# Patient Record
Sex: Female | Born: 1980 | Race: White | Hispanic: No | Marital: Single | Smoking: Never smoker
Health system: Southern US, Community
[De-identification: ages and names within clinical notes are randomized; demographics above are authoritative.]

## PROBLEM LIST (undated history)

## (undated) DIAGNOSIS — T7840XA Allergy, unspecified, initial encounter: Secondary | ICD-10-CM

## (undated) DIAGNOSIS — F909 Attention-deficit hyperactivity disorder, unspecified type: Secondary | ICD-10-CM

## (undated) HISTORY — PX: TONSILLECTOMY: SUR1361

## (undated) HISTORY — DX: Allergy, unspecified, initial encounter: T78.40XA

---
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
## 2001-10-31 ENCOUNTER — Other Ambulatory Visit: Admission: RE | Admit: 2001-10-31 | Discharge: 2001-10-31 | Payer: Self-pay | Admitting: *Deleted

## 2002-10-29 ENCOUNTER — Other Ambulatory Visit: Admission: RE | Admit: 2002-10-29 | Discharge: 2002-10-29 | Payer: Self-pay | Admitting: Obstetrics and Gynecology

## 2003-11-03 ENCOUNTER — Other Ambulatory Visit: Admission: RE | Admit: 2003-11-03 | Discharge: 2003-11-03 | Payer: Self-pay | Admitting: Obstetrics and Gynecology

## 2004-11-05 ENCOUNTER — Other Ambulatory Visit: Admission: RE | Admit: 2004-11-05 | Discharge: 2004-11-05 | Payer: Self-pay | Admitting: Obstetrics and Gynecology

## 2005-11-29 ENCOUNTER — Other Ambulatory Visit: Admission: RE | Admit: 2005-11-29 | Discharge: 2005-11-29 | Payer: Self-pay | Admitting: Obstetrics and Gynecology

## 2007-01-23 ENCOUNTER — Other Ambulatory Visit: Admission: RE | Admit: 2007-01-23 | Discharge: 2007-01-23 | Payer: Self-pay | Admitting: Obstetrics and Gynecology

## 2008-01-28 ENCOUNTER — Other Ambulatory Visit: Admission: RE | Admit: 2008-01-28 | Discharge: 2008-01-28 | Payer: Self-pay | Admitting: Obstetrics and Gynecology

## 2009-01-28 ENCOUNTER — Other Ambulatory Visit: Admission: RE | Admit: 2009-01-28 | Discharge: 2009-01-28 | Payer: Self-pay | Admitting: Obstetrics & Gynecology

## 2012-03-12 ENCOUNTER — Other Ambulatory Visit: Payer: Self-pay | Admitting: Family Medicine

## 2012-03-13 NOTE — Telephone Encounter (Signed)
NEEDS OV 

## 2012-09-25 ENCOUNTER — Other Ambulatory Visit: Payer: Self-pay | Admitting: Family Medicine

## 2012-10-03 ENCOUNTER — Other Ambulatory Visit: Payer: Self-pay | Admitting: Family Medicine

## 2012-10-03 DIAGNOSIS — R52 Pain, unspecified: Secondary | ICD-10-CM

## 2012-10-03 DIAGNOSIS — N390 Urinary tract infection, site not specified: Secondary | ICD-10-CM

## 2012-10-04 ENCOUNTER — Ambulatory Visit
Admission: RE | Admit: 2012-10-04 | Discharge: 2012-10-04 | Disposition: A | Discharge status: Home / Self Care | Payer: 59 | Source: Ambulatory Visit | Attending: Family Medicine | Admitting: Family Medicine

## 2012-10-04 DIAGNOSIS — N390 Urinary tract infection, site not specified: Secondary | ICD-10-CM

## 2012-10-04 DIAGNOSIS — R52 Pain, unspecified: Secondary | ICD-10-CM

## 2012-10-04 MED ORDER — IOHEXOL 300 MG/ML  SOLN
100.0000 mL | Freq: Once | INTRAMUSCULAR | Status: AC | PRN
  Administered 2012-10-04: 100 mL via INTRAVENOUS

## 2013-07-16 ENCOUNTER — Ambulatory Visit (INDEPENDENT_AMBULATORY_CARE_PROVIDER_SITE_OTHER): Payer: 59 | Admitting: Family Medicine

## 2013-07-16 VITALS — BP 116/60 | HR 91 | Temp 98.0°F | Resp 18 | Ht 64.0 in | Wt 163.0 lb

## 2013-07-16 DIAGNOSIS — R300 Dysuria: Secondary | ICD-10-CM

## 2013-07-16 DIAGNOSIS — M549 Dorsalgia, unspecified: Secondary | ICD-10-CM

## 2013-07-16 DIAGNOSIS — N390 Urinary tract infection, site not specified: Secondary | ICD-10-CM

## 2013-07-16 LAB — POCT UA - MICROSCOPIC ONLY
Bacteria, U Microscopic: NEGATIVE
Casts, Ur, LPF, POC: NEGATIVE
Crystals, Ur, HPF, POC: NEGATIVE
Mucus, UA: NEGATIVE
Yeast, UA: NEGATIVE

## 2013-07-16 LAB — POCT URINALYSIS DIPSTICK
Bilirubin, UA: NEGATIVE
Glucose, UA: NEGATIVE
Leukocytes, UA: NEGATIVE
Nitrite, UA: NEGATIVE
Protein, UA: NEGATIVE
Spec Grav, UA: 1.025
Urobilinogen, UA: 0.2
pH, UA: 5.5

## 2013-07-16 LAB — POCT URINE PREGNANCY: Preg Test, Ur: NEGATIVE

## 2013-07-16 MED ORDER — NITROFURANTOIN MONOHYD MACRO 100 MG PO CAPS: 100.0000 mg | ORAL_CAPSULE | Freq: Two times a day (BID) | ORAL | Status: DC

## 2013-07-16 NOTE — Progress Notes (Signed)
  Subjective:    Patient ID: Elaine Atkins, female    DOB: Sep 20, 1981, 32 y.o.   MRN: [id number]  HPI 32 yo female with complaint of lower abdominal and low back pain for the past 3-4 days.  No fever or chills.  Positive burning with urination.  No vomiting or diarrhea.  No vaginal discharge or bleeding.  LMP 07/04/13.  Patient has history of pyelonephritis in past and states it started similar to this.  She was recently treated with Z-pack for recent URI.  Currently asymptomatic from that.  Past Medical History  Diagnosis Date  . Allergy    History reviewed. No pertinent past surgical history. Allergies  Allergen Reactions  . Nexium [Esomeprazole Magnesium]   . Septra [Sulfamethoxazole W-Trimethoprim]     Review of Systems  As per HPI otherwise negative.    Objective:   Physical Exam Blood pressure 116/60, pulse 91, temperature 98 F (36.7 C), temperature source Oral, resp. rate 18, height 5\' 4"  (1.626 m), weight 163 lb (73.936 kg), last menstrual period 07/04/2013, SpO2 100.00%. Body mass index is 27.97 kg/(m^2). Well-developed, well nourished female who is awake, alert and oriented, in NAD. HEENT: [place]/AT, PERRL, EOMI.  Sclera and conjunctiva are clear.   OP is clear. Neck: supple, non-tender, no lymphadenopathy, thyromegaly. Heart: RRR, no murmur Lungs: normal effort, CTA Abdomen: normo-active bowel sounds, supple, mild suprapubic tenderness, no mass or organomegaly. Back:  CVA tenderness. Extremities: no cyanosis, clubbing or edema. Skin: warm and Atkins without rash. Psychologic: good mood and appropriate affect, normal speech and behavior.  Results for orders placed in visit on 07/16/13  POCT URINALYSIS DIPSTICK      Result Value Range   Color, UA YELLOW     Clarity, UA SLIGHTLY CLOUDY     Glucose, UA NEG     Bilirubin, UA NEG     Ketones, UA TRACE     Spec Grav, UA 1.025     Blood, UA TRACE     pH, UA 5.5     Protein, UA NEG     Urobilinogen, UA 0.2     Nitrite, UA  NEG     Leukocytes, UA Negative    POCT UA - MICROSCOPIC ONLY      Result Value Range   WBC, Ur, HPF, POC 0-1     RBC, urine, microscopic 0-1     Bacteria, U Microscopic NEG     Mucus, UA NEG     Epithelial cells, urine per micros 1-4     Crystals, Ur, HPF, POC NEG     Casts, Ur, LPF, POC NEG     Yeast, UA NEG    POCT URINE PREGNANCY      Result Value Range   Preg Test, Ur Negative            Assessment & Plan:  Symptoms consistent with urinary tract infection (dysuria, suprapubic pain and mild cva tenderness).  Will cover with antibiotics  Follow up in 2 weeks.

## 2013-07-16 NOTE — Patient Instructions (Signed)
Urinary Tract Infection Urinary tract infections (UTIs) can develop anywhere along your urinary tract. Your urinary tract is your body's drainage system for removing wastes and extra water. Your urinary tract includes two kidneys, two ureters, a bladder, and a urethra. Your kidneys are a pair of bean-shaped organs. Each kidney is about the size of your fist. They are located below your ribs, one on each side of your spine. CAUSES Infections are caused by microbes, which are microscopic organisms, including fungi, viruses, and bacteria. These organisms are so small that they can only be seen through a microscope. Bacteria are the microbes that most commonly cause UTIs. SYMPTOMS  Symptoms of UTIs may vary by age and gender of the patient and by the location of the infection. Symptoms in young women typically include a frequent and intense urge to urinate and a painful, burning feeling in the bladder or urethra during urination. Older women and men are more likely to be tired, shaky, and weak and have muscle aches and abdominal pain. A fever may mean the infection is in your kidneys. Other symptoms of a kidney infection include pain in your back or sides below the ribs, nausea, and vomiting. DIAGNOSIS To diagnose a UTI, your caregiver will ask you about your symptoms. Your caregiver also will ask to provide a urine sample. The urine sample will be tested for bacteria and white blood cells. White blood cells are made by your body to help fight infection. TREATMENT  Typically, UTIs can be treated with medication. Because most UTIs are caused by a bacterial infection, they usually can be treated with the use of antibiotics. The choice of antibiotic and length of treatment depend on your symptoms and the type of bacteria causing your infection. HOME CARE INSTRUCTIONS  If you were prescribed antibiotics, take them exactly as your caregiver instructs you. Finish the medication even if you feel better after you  have only taken some of the medication.  Drink enough water and fluids to keep your urine clear or pale yellow.  Avoid caffeine, tea, and carbonated beverages. They tend to irritate your bladder.  Empty your bladder often. Avoid holding urine for long periods of time.  Empty your bladder before and after sexual intercourse.  After a bowel movement, women should cleanse from front to back. Use each tissue only once. SEEK MEDICAL CARE IF:   You have back pain.  You develop a fever.  Your symptoms do not begin to resolve within 3 days. SEEK IMMEDIATE MEDICAL CARE IF:   You have severe back pain or lower abdominal pain.  You develop chills.  You have nausea or vomiting.  You have continued burning or discomfort with urination. MAKE SURE YOU:   Understand these instructions.  Will watch your condition.  Will get help right away if you are not doing well or get worse. Document Released: 08/17/2005 Document Revised: 05/08/2012 Document Reviewed: 12/16/2011 [facility] Patient Information 2014 [place], [place].  Your symptoms are consistent with a UTI despite the negative urine test.  A culture is being sent today and you are being started on antibiotics.  If you have any problems return immediately, otherwise take the antibiotics as directed.

## 2013-07-18 LAB — URINE CULTURE: Colony Count: NO GROWTH

## 2013-07-18 NOTE — Progress Notes (Signed)
History and physical exam reviewed in detail; labs reviewed; agree with A/P.

## 2013-09-26 ENCOUNTER — Other Ambulatory Visit: Payer: Self-pay

## 2014-07-25 ENCOUNTER — Ambulatory Visit (INDEPENDENT_AMBULATORY_CARE_PROVIDER_SITE_OTHER): Payer: 59 | Admitting: Physician Assistant

## 2014-07-25 VITALS — BP 128/74 | HR 99 | Temp 98.1°F | Resp 16 | Ht 64.0 in | Wt 165.0 lb

## 2014-07-25 DIAGNOSIS — L02419 Cutaneous abscess of limb, unspecified: Secondary | ICD-10-CM

## 2014-07-25 DIAGNOSIS — L03119 Cellulitis of unspecified part of limb: Principal | ICD-10-CM

## 2014-07-25 MED ORDER — DOXYCYCLINE HYCLATE 100 MG PO CAPS: 100.0000 mg | ORAL_CAPSULE | Freq: Two times a day (BID) | ORAL | Status: AC

## 2014-07-25 NOTE — Patient Instructions (Signed)
Take the antibiotic as prescribed. Apply a warm compress to the area for 15-20 minutes 2-4 times each day.

## 2014-07-25 NOTE — Progress Notes (Signed)
   Subjective:    Patient ID: Elaine Atkins, female    DOB: 03-Oct-1981, 33 y.o.   MRN: [id number]   PCP: No primary provider on file.  Chief Complaint  Patient presents with  . Insect Bite    fire ants   Medications, allergies, past medical history, surgical history, family history, social history and problem list reviewed and updated.  HPI  This 33 y.o. female presents for evaluation of lesions on her LEFT leg. She stepped in a fire ant hill on Sunday (07/20/2014) afternoon, and got bites on the foot and up her leg. The next day, two lesions became painful, large and red.  Popped them, with some improvement, but still very painful. No fever, chills, nausea/vomiting.  Review of Systems     Objective:   Physical Exam  Constitutional: She is oriented to person, place, and time. She appears well-developed and well-nourished. No distress.  BP 128/74  Pulse 99  Temp(Src) 98.1 F (36.7 C) (Oral)  Resp 16  Ht  (1.626 m)  Wt 165 lb (74.844 kg)  BMI 28.31 kg/m2  SpO2 98%  LMP 07/11/2014   Pulmonary/Chest: Effort normal.  Neurological: She is alert and oriented to person, place, and time.  Skin: Skin is warm and Atkins.     The forefoot with multiple small lesions, consistent with resolving bites.  The two lesions higher on the leg each measure approximately 1 cm and are excoriated with surrounding erythema and induration. Tender.  Non-fluctuant. No drainage.  Psychiatric: She has a normal mood and affect. Her speech is normal and behavior is normal.          Assessment & Plan:  1. Cellulitis and abscess of leg Secondary to fire ant bites.  - doxycycline (VIBRAMYCIN) 100 MG capsule; Take 1 capsule (100 mg total) by mouth 2 (two) times daily.  Dispense: 20 capsule; Refill: 0  Return if symptoms worsen or fail to improve.  Fernande Bras, PA-C Physician Assistant-Certified [facility]

## 2014-10-07 ENCOUNTER — Ambulatory Visit (INDEPENDENT_AMBULATORY_CARE_PROVIDER_SITE_OTHER): Payer: 59 | Admitting: Family Medicine

## 2014-10-07 VITALS — BP 110/68 | HR 99 | Temp 97.5°F | Resp 18 | Ht 64.0 in | Wt 163.0 lb

## 2014-10-07 DIAGNOSIS — M545 Low back pain: Secondary | ICD-10-CM

## 2014-10-07 DIAGNOSIS — R102 Pelvic and perineal pain: Secondary | ICD-10-CM

## 2014-10-07 DIAGNOSIS — R5383 Other fatigue: Secondary | ICD-10-CM

## 2014-10-07 DIAGNOSIS — R109 Unspecified abdominal pain: Secondary | ICD-10-CM

## 2014-10-07 LAB — POCT WET PREP WITH KOH
BACTERIA WET PREP HPF POC: NEGATIVE
Clue Cells Wet Prep HPF POC: NEGATIVE
KOH Prep POC: NEGATIVE
RBC Wet Prep HPF POC: NEGATIVE
Trichomonas, UA: NEGATIVE
WBC Wet Prep HPF POC: NEGATIVE
Yeast Wet Prep HPF POC: NEGATIVE

## 2014-10-07 LAB — POCT UA - MICROSCOPIC ONLY
BACTERIA, U MICROSCOPIC: NEGATIVE
CASTS, UR, LPF, POC: NEGATIVE
Crystals, Ur, HPF, POC: NEGATIVE
MUCUS UA: NEGATIVE
RBC, urine, microscopic: NEGATIVE
WBC, UR, HPF, POC: NEGATIVE
YEAST UA: NEGATIVE

## 2014-10-07 LAB — POCT CBC
Granulocyte percent: 52 %G (ref 37–80)
HEMATOCRIT: 41.4 % (ref 37.7–47.9)
HEMOGLOBIN: 13.6 g/dL (ref 12.2–16.2)
LYMPH, POC: 2.6 (ref 0.6–3.4)
MCH: 29 pg (ref 27–31.2)
MCHC: 32.8 g/dL (ref 31.8–35.4)
MCV: 88.6 fL (ref 80–97)
MID (cbc): 0.2 (ref 0–0.9)
MPV: 7.2 fL (ref 0–99.8)
POC GRANULOCYTE: 3.1 (ref 2–6.9)
POC LYMPH %: 43.8 % (ref 10–50)
POC MID %: 4.2 %M (ref 0–12)
Platelet Count, POC: 257 10*3/uL (ref 142–424)
RBC: 4.68 M/uL (ref 4.04–5.48)
RDW, POC: 13.4 %
WBC: 5.9 10*3/uL (ref 4.6–10.2)

## 2014-10-07 LAB — POCT URINALYSIS DIPSTICK
BILIRUBIN UA: NEGATIVE
Glucose, UA: NEGATIVE
KETONES UA: NEGATIVE
LEUKOCYTES UA: NEGATIVE
NITRITE UA: NEGATIVE
PH UA: 5.5
PROTEIN UA: NEGATIVE
Spec Grav, UA: <=1.005
Urobilinogen, UA: 0.2

## 2014-10-07 LAB — POCT URINE PREGNANCY: Preg Test, Ur: NEGATIVE

## 2014-10-07 NOTE — Patient Instructions (Signed)
Ovarian Cyst An ovarian cyst is a fluid-filled sac that forms on an ovary. The ovaries are small organs that produce eggs in women. Various types of cysts can form on the ovaries. Most are not cancerous. Many do not cause problems, and they often go away on their own. Some may cause symptoms and require treatment. Common types of ovarian cysts include:  Functional cysts--These cysts may occur every month during the menstrual cycle. This is normal. The cysts usually go away with the next menstrual cycle if the woman does not get pregnant. Usually, there are no symptoms with a functional cyst.  Endometrioma cysts--These cysts form from the tissue that lines the uterus. They are also called "chocolate cysts" because they become filled with blood that turns brown. This type of cyst can cause pain in the lower abdomen during intercourse and with your menstrual period.  Cystadenoma cysts--This type develops from the cells on the outside of the ovary. These cysts can get very big and cause lower abdomen pain and pain with intercourse. This type of cyst can twist on itself, cut off its blood supply, and cause severe pain. It can also easily rupture and cause a lot of pain.  Dermoid cysts--This type of cyst is sometimes found in both ovaries. These cysts may contain different kinds of body tissue, such as skin, teeth, hair, or cartilage. They usually do not cause symptoms unless they get very big.  Theca lutein cysts--These cysts occur when too much of a certain hormone (human chorionic gonadotropin) is produced and overstimulates the ovaries to produce an egg. This is most common after procedures used to assist with the conception of a baby (in vitro fertilization). CAUSES   Fertility drugs can cause a condition in which multiple large cysts are formed on the ovaries. This is called ovarian hyperstimulation syndrome.  A condition called polycystic ovary syndrome can cause hormonal imbalances that can lead to  nonfunctional ovarian cysts. SIGNS AND SYMPTOMS  Many ovarian cysts do not cause symptoms. If symptoms are present, they may include:  Pelvic pain or pressure.  Pain in the lower abdomen.  Pain during sexual intercourse.  Increasing girth (swelling) of the abdomen.  Abnormal menstrual periods.  Increasing pain with menstrual periods.  Stopping having menstrual periods without being pregnant. DIAGNOSIS  These cysts are commonly found during a routine or annual pelvic exam. Tests may be ordered to find out more about the cyst. These tests may include:  Ultrasound.  X-ray of the pelvis.  CT scan.  MRI.  Blood tests. TREATMENT  Many ovarian cysts go away on their own without treatment. Your health care provider may want to check your cyst regularly for 2-3 months to see if it changes. For women in menopause, it is particularly important to monitor a cyst closely because of the higher rate of ovarian cancer in menopausal women. When treatment is needed, it may include any of the following:  A procedure to drain the cyst (aspiration). This may be done using a long needle and ultrasound. It can also be done through a laparoscopic procedure. This involves using a thin, lighted tube with a tiny camera on the end (laparoscope) inserted through a small incision.  Surgery to remove the whole cyst. This may be done using laparoscopic surgery or an open surgery involving a larger incision in the lower abdomen.  Hormone treatment or birth control pills. These methods are sometimes used to help dissolve a cyst. HOME CARE INSTRUCTIONS   Only take over-the-counter   or prescription medicines as directed by your health care provider.  Follow up with your health care provider as directed.  Get regular pelvic exams and Pap tests. SEEK MEDICAL CARE IF:   Your periods are late, irregular, or painful, or they stop.  Your pelvic pain or abdominal pain does not go away.  Your abdomen becomes  larger or swollen.  You have pressure on your bladder or trouble emptying your bladder completely.  You have pain during sexual intercourse.  You have feelings of fullness, pressure, or discomfort in your stomach.  You lose weight for no apparent reason.  You feel generally ill.  You become constipated.  You lose your appetite.  You develop acne.  You have an increase in body and facial hair.  You are gaining weight, without changing your exercise and eating habits.  You think you are pregnant. SEEK IMMEDIATE MEDICAL CARE IF:   You have increasing abdominal pain.  You feel sick to your stomach (nauseous), and you throw up (vomit).  You develop a fever that comes on suddenly.  You have abdominal pain during a bowel movement.  Your menstrual periods become heavier than usual. MAKE SURE YOU:  Understand these instructions.  Will watch your condition.  Will get help right away if you are not doing well or get worse. Document Released: 11/07/2005 Document Revised: 11/12/2013 Document Reviewed: 07/15/2013 [facility] Patient Information 2015 [place], [place]. This information is not intended to replace advice given to you by your health care provider. Make sure you discuss any questions you have with your health care provider.  Your pregnancy test is negative. Your urinalysis is negative. You do not have an elevated white count, therefore infection is not as likely.  - we will call you with the results of your other labs once they are available.  - we have have ordered abdominal US, they will callyou to schedule within a few days. - take naproxen for pain -if your pain becomes worse then please call back in to discuss.

## 2014-10-07 NOTE — Progress Notes (Signed)
Subjective:    Patient ID: Elaine Atkins, female    DOB: Jul 24, 1981, 33 y.o.   MRN: [id number]  HPI Elaine DryCarrie A Rotenberg is a 33 y.o. female   Lower Backpain: Pt states she has a kidney infection a few years ago and feels it is consistent with the pain she had when she had a kidney infection. Patient denies trauma or strain in her back. She denies dysuria, fever, chills, diarrhea, urine odor or discoloration. Endorses fatigue, lower abdominal pain for three days. Back pain started yesterday. Patient is tolerating PO. She does not have a history of kidney stones, there is a family history. She is allergic to Suprax.   Patient's last menstrual period was 09/29/2014. She uses the nuvoring for Birth control, she does admit to missing 2 days because of pharmacy issue. She has a history of cyst on her ovaries, but nothing current. Has had unprotected sex, with partner, they have been monogamous for years. No vaginal irritation or discharge.   There are no active problems to display for this patient.  Past Medical History  Diagnosis Date  . Allergy    Past Surgical History  Procedure Laterality Date  . Tonsillectomy     Allergies  Allergen Reactions  . Nexium [Esomeprazole Magnesium] Anaphylaxis  . Suprax [Cefixime]     Childhood; unknown reaction   Prior to Admission medications   Medication Sig Start Date End Date Taking? Authorizing Provider  etonogestrel-ethinyl estradiol (NUVARING) 0.12-0.015 MG/24HR vaginal ring Place 1 each vaginally every 28 (twenty-eight) days. Insert vaginally and leave in place for 3 consecutive weeks, then remove for 1 week.   Yes Historical Provider, MD  fluticasone (FLONASE) 50 MCG/ACT nasal spray instill 1 to 2 sprays into each nostril once daily as directed 03/12/12  Yes Sondra Bargesyan M Dunn, PA-C   History   Social History  . Marital Status: Single    Spouse Name: n/a    Number of Children: 0  . Years of Education: N/A   Occupational History  . Print production plannerffice Manager      Social History Main Topics  . Smoking status: Never Smoker   . Smokeless tobacco: Never Used  . Alcohol Use: No  . Drug Use: Not on file  . Sexual Activity: Not on file   Other Topics Concern  . Not on file   Social History Narrative    Review of Systems Per hpi    Objective:   Physical Exam Filed Vitals:   10/07/14 0910  BP: 110/68  Pulse: 99  Temp: 97.5 F (36.4 C)  TempSrc: Oral  Resp: 18  Height: 5\' 4"  (1.626 m)  Weight: 163 lb (73.936 kg)  SpO2: 97%   Gen: [place], caucasian female, NAD, Non-toxic in appearance. Appears fatigued.  HEENT: AT. [place].  Bilateral eyes without injections or icterus. MMM.  CV: RRR  Chest: CTAB, no wheeze or crackles Abd: Soft. Flat. ND.  Suprapubic tenderness BS present. No Masses palpated.  MSK: CVA tenderness left.  Skin: No rashes, purpura or petechiae.   GYN:  External genitalia within normal limits.  Vaginal mucosa pink, moist, normal rugae.  Mildly friable cervix without nabothian cyst, no discharge or bleeding noted on speculum exam. No cervical motion tenderness. No adnexal masses bilaterally. TTP left>> right adnexal.        Assessment & Plan:  Elaine DryCarrie A Robak is a 33 y.o. female presents with ABD PAIN and backpain left>right.  - UA and micro normal - Upreg: Negative - CBC: WNL,  No elevated WBC - STD wet prep and cultures collected - pelvic US ordered.  - naproxen for pain, PRN  Felix PaciniKuneff, Renee DO PGY-3 [facility]  Patient discussed with Dr. Claiborne BillingsKuneff. Agree with assessment and plan of care per her note. DDX of ovarian cyst, or possible early gastroenteritis. Reassuring CBC, U/a and et prep in office. Agree with pelvic ultrasound and STI testing. Sx care and rtc precautions.

## 2014-10-08 ENCOUNTER — Inpatient Hospital Stay: Admission: RE | Admit: 2014-10-08 | Payer: 59 | Source: Ambulatory Visit

## 2014-10-08 ENCOUNTER — Other Ambulatory Visit: Payer: Self-pay | Admitting: Family Medicine

## 2014-10-08 DIAGNOSIS — R102 Pelvic and perineal pain: Secondary | ICD-10-CM

## 2014-10-08 LAB — GC/CHLAMYDIA PROBE AMP, URINE
Chlamydia, Swab/Urine, PCR: NEGATIVE
GC Probe Amp, Urine: NEGATIVE

## 2014-10-09 ENCOUNTER — Ambulatory Visit
Admission: RE | Admit: 2014-10-09 | Discharge: 2014-10-09 | Disposition: A | Discharge status: Home / Self Care | Payer: 59 | Source: Ambulatory Visit | Attending: Family Medicine | Admitting: Family Medicine

## 2014-10-09 DIAGNOSIS — R102 Pelvic and perineal pain: Secondary | ICD-10-CM

## 2014-10-17 ENCOUNTER — Encounter: Payer: Self-pay | Admitting: Radiology

## 2014-10-26 ENCOUNTER — Ambulatory Visit (INDEPENDENT_AMBULATORY_CARE_PROVIDER_SITE_OTHER): Payer: 59

## 2014-10-26 ENCOUNTER — Ambulatory Visit (INDEPENDENT_AMBULATORY_CARE_PROVIDER_SITE_OTHER): Payer: 59 | Admitting: Family Medicine

## 2014-10-26 VITALS — BP 140/92 | HR 117 | Temp 98.8°F | Resp 16 | Ht 64.0 in | Wt 165.6 lb

## 2014-10-26 DIAGNOSIS — M545 Low back pain: Secondary | ICD-10-CM

## 2014-10-26 DIAGNOSIS — R1032 Left lower quadrant pain: Secondary | ICD-10-CM

## 2014-10-26 DIAGNOSIS — K5909 Other constipation: Secondary | ICD-10-CM

## 2014-10-26 LAB — POCT URINALYSIS DIPSTICK
Bilirubin, UA: NEGATIVE
Blood, UA: NEGATIVE
Glucose, UA: NEGATIVE
KETONES UA: NEGATIVE
Leukocytes, UA: NEGATIVE
Nitrite, UA: NEGATIVE
PROTEIN UA: NEGATIVE
Spec Grav, UA: <=1.005
Urobilinogen, UA: 0.2
pH, UA: 5

## 2014-10-26 LAB — POCT UA - MICROSCOPIC ONLY
BACTERIA, U MICROSCOPIC: NEGATIVE
CRYSTALS, UR, HPF, POC: NEGATIVE
Casts, Ur, LPF, POC: NEGATIVE
MUCUS UA: NEGATIVE
RBC, urine, microscopic: NEGATIVE
WBC, Ur, HPF, POC: NEGATIVE
YEAST UA: NEGATIVE

## 2014-10-26 MED ORDER — METHOCARBAMOL 500 MG PO TABS: 500.0000 mg | ORAL_TABLET | Freq: Every evening | ORAL | Status: DC | PRN

## 2014-10-26 MED ORDER — MELOXICAM 15 MG PO TABS: 15.0000 mg | ORAL_TABLET | Freq: Every day | ORAL | Status: DC

## 2014-10-26 NOTE — Progress Notes (Signed)
Subjective:    Patient ID: Elaine Atkins, female    DOB: [birth year], 33 y.o.   MRN: [id number]  10/26/2014  Abdominal Pain; Fatigue; and Back Pain   HPI This 33 y.o. female presents for evaluation of persistent lower back pain.  Onset three weeks ago.  Developed L lower back side.  History of UTI a few years ago; had lower back pain and lower pelvic pian.  Presented last month for symptoms; performed urine test and negative. S/p pelvic exam.  Lower pelvic pain; s/p pelvic ultrasound that was normal.  S/p Wet Prep negative; GC/Chlam negative; urine pregnancy negative; CBC normal; u/a negative.  Went to GYN; normal exam; felt due to muscular process, urological process or GI process.  No improvement.  Has seen changes while in bathroom; has seen small brown flecks and red flecks in urine.    Mild dysuria, +urgency, +frequency mild; nocturia at baseline.  No fever.  +chills.    Some issues with loose stools; two days with constipation.  Unable to completely empty.  Baseline is dail bowel movement.  No stool softener; has been increasing fiber.  Improvement in bowel movements; does not feel constipation.   Sharp pain on L side. No heartburn, n/v.  Appetite normal.  Eating can make pain worse.    Lower back pain is transient.  Worse with sitting; less severe with standing. No raidation into legs; does have achiness in B legs.  B hips are tender.     PCP: [facility]  Regular menses; + Nuvaring.  LMP 10-04-14.  Really concerned about a recurrent UTI or kidney stone.   Review of Systems  Constitutional: Negative for fever, chills, diaphoresis and fatigue.  Gastrointestinal: Positive for abdominal pain and constipation. Negative for nausea, vomiting, diarrhea, blood in stool, abdominal distention and anal bleeding.  Genitourinary: Positive for urgency, frequency and pelvic pain. Negative for dysuria, hematuria, flank pain, decreased urine volume, vaginal bleeding, vaginal discharge,  genital sores, vaginal pain and menstrual problem.  Musculoskeletal: Positive for myalgias and back pain.  Skin: Negative for rash.    Past Medical History  Diagnosis Date  . Allergy    Past Surgical History  Procedure Laterality Date  . Tonsillectomy     Allergies  Allergen Reactions  . Nexium [Esomeprazole Magnesium] Anaphylaxis  . Suprax [Cefixime]     Childhood; unknown reaction   Current Outpatient Prescriptions  Medication Sig Dispense Refill  . etonogestrel-ethinyl estradiol (NUVARING) 0.12-0.015 MG/24HR vaginal ring Place 1 each vaginally every 28 (twenty-eight) days. Insert vaginally and leave in place for 3 consecutive weeks, then remove for 1 week.    . fluticasone (FLONASE) 50 MCG/ACT nasal spray instill 1 to 2 sprays into each nostril once daily as directed 16 g 0   No current facility-administered medications for this visit.       Objective:    BP 140/92 mmHg  Pulse 117  Temp(Src) 98.8 F (37.1 C) (Oral)  Resp 16  Ht 5\' 4"  (1.626 m)  Wt 165 lb 9.6 oz (75.116 kg)  BMI 28.41 kg/m2  SpO2 98%  LMP 09/29/2014 Physical Exam  Constitutional: She is oriented to person, place, and time. She appears well-developed and well-nourished. No distress.  HENT:  Head: Normocephalic and atraumatic.  Eyes: Conjunctivae are normal. Pupils are equal, round, and reactive to light.  Neck: Normal range of motion. Neck supple.  Cardiovascular: Normal rate, regular rhythm and normal heart sounds.  Exam reveals no gallop and no friction rub.  No murmur heard. Pulmonary/Chest: Effort normal and breath sounds normal. She has no wheezes. She has no rales.  Abdominal: Soft. Bowel sounds are normal. She exhibits no distension and no mass. There is no hepatosplenomegaly. There is tenderness in the left lower quadrant. There is CVA tenderness. There is no rebound and no guarding. No hernia.  Neurological: She is alert and oriented to person, place, and time.  Skin: No rash noted. She  is not diaphoretic.  Psychiatric: She has a normal mood and affect. Her behavior is normal.  Nursing note and vitals reviewed.  Results for orders placed or performed in visit on 10/26/14  POCT UA - Microscopic Only  Result Value Ref Range   WBC, Ur, HPF, POC neg    RBC, urine, microscopic neg    Bacteria, U Microscopic neg    Mucus, UA neg    Epithelial cells, urine per micros 0-2    Crystals, Ur, HPF, POC neg    Casts, Ur, LPF, POC neg    Yeast, UA neg   POCT urinalysis dipstick  Result Value Ref Range   Color, UA Yellow    Clarity, UA Clear    Glucose, UA Neg    Bilirubin, UA Neg    Ketones, UA Neg    Spec Grav, UA <=1.005    Blood, UA Neg    pH, UA 5.0    Protein, UA Neg    Urobilinogen, UA 0.2    Nitrite, UA Neg    Leukocytes, UA Negative    [facility] reading (PRIMARY) by  Dr. Katrinka BlazingSmith. KUB:  MODERATE STOOL BURDEN; LUMBAR SPINE: NAD      Assessment & Plan:   1. Low back pain, unspecified back pain laterality, with sciatica presence unspecified   2. Abdominal pain, LLQ   3. Other constipation     1.  Lower back pain:  Persistent.  Worsens with changes in position so suggestive of musculoskeletal etiology.  xrays obtained and negative; rx for Mobic 15mg  daily and Robaxin provided.  Home exercise program provided to perform daily.  Call in two weeks if no improvement. 2.  LLQ pain:  Persistent/unchanged.  Pelvic ultrasound negative.  S/p gynecology evaluation; Wet prep negative; GC/Chlam negative.  Urine negative x 2.   3. Constipation: persistent; recommend Miralax daily for two weeks. Encourage fluids and fiber.      No orders of the defined types were placed in this encounter.    No Follow-up on file.     Nilda SimmerKristi Oda Lansdowne, M.D.  [facility]  [place] [address] [place], [place]  [id number] [phone] phone [phone] fax

## 2014-10-26 NOTE — Patient Instructions (Addendum)
1.  RECOMMEND MIRALAX ONE CAPFUL DAILY FOR TWO WEEKS. 2.  TAKING ROBAXIN 1-2 AT BEDTIME FOR TWO WEEKS. 3. PERFORM LOW BACK EXERCISES DAILY.   Low Back Sprain with Rehab  A sprain is an injury in which a ligament is torn. The ligaments of the lower back are vulnerable to sprains. However, they are strong and require great force to be injured. These ligaments are important for stabilizing the spinal column. Sprains are classified into three categories. Grade 1 sprains cause pain, but the tendon is not lengthened. Grade 2 sprains include a lengthened ligament, due to the ligament being stretched or partially ruptured. With grade 2 sprains there is still function, although the function may be decreased. Grade 3 sprains involve a complete tear of the tendon or muscle, and function is usually impaired. SYMPTOMS   Severe pain in the lower back.  Sometimes, a feeling of a "pop," "snap," or tear, at the time of injury.  Tenderness and sometimes swelling at the injury site.  Uncommonly, bruising (contusion) within 48 hours of injury.  Muscle spasms in the back. CAUSES  Low back sprains occur when a force is placed on the ligaments that is greater than they can handle. Common causes of injury include:  Performing a stressful act while off-balance.  Repetitive stressful activities that involve movement of the lower back.  Direct hit (trauma) to the lower back. RISK INCREASES WITH:  Contact sports (football, wrestling).  Collisions (major skiing accidents).  Sports that require throwing or lifting (baseball, weightlifting).  Sports involving twisting of the spine (gymnastics, diving, tennis, golf).  Poor strength and flexibility.  Inadequate protection.  Previous back injury or surgery (especially fusion). PREVENTION  Wear properly fitted and padded protective equipment.  Warm up and stretch properly before activity.  Allow for adequate recovery between workouts.  Maintain  physical fitness:  Strength, flexibility, and endurance.  Cardiovascular fitness.  Maintain a healthy body weight. PROGNOSIS  If treated properly, low back sprains usually heal with non-surgical treatment. The length of time for healing depends on the severity of the injury.  RELATED COMPLICATIONS   Recurring symptoms, resulting in a chronic problem.  Chronic inflammation and pain in the low back.  Delayed healing or resolution of symptoms, especially if activity is resumed too soon.  Prolonged impairment.  Unstable or arthritic joints of the low back. TREATMENT  Treatment first involves the use of ice and medicine, to reduce pain and inflammation. The use of strengthening and stretching exercises may help reduce pain with activity. These exercises may be performed at home or with a therapist. Severe injuries may require referral to a therapist for further evaluation and treatment, such as ultrasound. Your caregiver may advise that you wear a back brace or corset, to help reduce pain and discomfort. Often, prolonged bed rest results in greater harm then benefit. Corticosteroid injections may be recommended. However, these should be reserved for the most serious cases. It is important to avoid using your back when lifting objects. At night, sleep on your back on a firm mattress, with a pillow placed under your knees. If non-surgical treatment is unsuccessful, surgery may be needed.  MEDICATION   If pain medicine is needed, nonsteroidal anti-inflammatory medicines (aspirin and ibuprofen), or other minor pain relievers (acetaminophen), are often advised.  Do not take pain medicine for 7 days before surgery.  Prescription pain relievers may be given, if your caregiver thinks they are needed. Use only as directed and only as much as you need.  Ointments applied to the skin may be helpful.  Corticosteroid injections may be given by your caregiver. These injections should be reserved for the  most serious cases, because they may only be given a certain number of times. HEAT AND COLD  Cold treatment (icing) should be applied for 10 to 15 minutes every 2 to 3 hours for inflammation and pain, and immediately after activity that aggravates your symptoms. Use ice packs or an ice massage.  Heat treatment may be used before performing stretching and strengthening activities prescribed by your caregiver, physical therapist, or athletic trainer. Use a heat pack or a warm water soak. SEEK MEDICAL CARE IF:   Symptoms get worse or do not improve in 2 to 4 weeks, despite treatment.  You develop numbness or weakness in either leg.  You lose bowel or bladder function.  Any of the following occur after surgery: fever, increased pain, swelling, redness, drainage of fluids, or bleeding in the affected area.  New, unexplained symptoms develop. (Drugs used in treatment may produce side effects.) EXERCISES  RANGE OF MOTION (ROM) AND STRETCHING EXERCISES - Low Back Sprain Most people with lower back pain will find that their symptoms get worse with excessive bending forward (flexion) or arching at the lower back (extension). The exercises that will help resolve your symptoms will focus on the opposite motion.  Your physician, physical therapist or athletic trainer will help you determine which exercises will be most helpful to resolve your lower back pain. Do not complete any exercises without first consulting with your caregiver. Discontinue any exercises which make your symptoms worse, until you speak to your caregiver. If you have pain, numbness or tingling which travels down into your buttocks, leg or foot, the goal of the therapy is for these symptoms to move closer to your back and eventually resolve. Sometimes, these leg symptoms will get better, but your lower back pain may worsen. This is often an indication of progress in your rehabilitation. Be very alert to any changes in your symptoms and the  activities in which you participated in the 24 hours prior to the change. Sharing this information with your caregiver will allow him or her to most efficiently treat your condition. These exercises may help you when beginning to rehabilitate your injury. Your symptoms may resolve with or without further involvement from your physician, physical therapist or athletic trainer. While completing these exercises, remember:   Restoring tissue flexibility helps normal motion to return to the joints. This allows healthier, less painful movement and activity.  An effective stretch should be held for at least 30 seconds.  A stretch should never be painful. You should only feel a gentle lengthening or release in the stretched tissue. FLEXION RANGE OF MOTION AND STRETCHING EXERCISES: STRETCH - Flexion, Single Knee to Chest   Lie on a firm bed or floor with both legs extended in front of you.  Keeping one leg in contact with the floor, bring your opposite knee to your chest. Hold your leg in place by either grabbing behind your thigh or at your knee.  Pull until you feel a gentle stretch in your low back. Hold __________ seconds.  Slowly release your grasp and repeat the exercise with the opposite side. Repeat __________ times. Complete this exercise __________ times per day.  STRETCH - Flexion, Double Knee to Chest  Lie on a firm bed or floor with both legs extended in front of you.  Keeping one leg in contact with the floor, bring  your opposite knee to your chest.  Tense your stomach muscles to support your back and then lift your other knee to your chest. Hold your legs in place by either grabbing behind your thighs or at your knees.  Pull both knees toward your chest until you feel a gentle stretch in your low back. Hold __________ seconds.  Tense your stomach muscles and slowly return one leg at a time to the floor. Repeat __________ times. Complete this exercise __________ times per day.    STRETCH - Low Trunk Rotation  Lie on a firm bed or floor. Keeping your legs in front of you, bend your knees so they are both pointed toward the ceiling and your feet are flat on the floor.  Extend your arms out to the side. This will stabilize your upper body by keeping your shoulders in contact with the floor.  Gently and slowly drop both knees together to one side until you feel a gentle stretch in your low back. Hold for __________ seconds.  Tense your stomach muscles to support your lower back as you bring your knees back to the starting position. Repeat the exercise to the other side. Repeat __________ times. Complete this exercise __________ times per day  EXTENSION RANGE OF MOTION AND FLEXIBILITY EXERCISES: STRETCH - Extension, Prone on Elbows   Lie on your stomach on the floor, a bed will be too soft. Place your palms about shoulder width apart and at the height of your head.  Place your elbows under your shoulders. If this is too painful, stack pillows under your chest.  Allow your body to relax so that your hips drop lower and make contact more completely with the floor.  Hold this position for __________ seconds.  Slowly return to lying flat on the floor. Repeat __________ times. Complete this exercise __________ times per day.  RANGE OF MOTION - Extension, Prone Press Ups  Lie on your stomach on the floor, a bed will be too soft. Place your palms about shoulder width apart and at the height of your head.  Keeping your back as relaxed as possible, slowly straighten your elbows while keeping your hips on the floor. You may adjust the placement of your hands to maximize your comfort. As you gain motion, your hands will come more underneath your shoulders.  Hold this position __________ seconds.  Slowly return to lying flat on the floor. Repeat __________ times. Complete this exercise __________ times per day.  RANGE OF MOTION- Quadruped, Neutral Spine   Assume a hands  and knees position on a firm surface. Keep your hands under your shoulders and your knees under your hips. You may place padding under your knees for comfort.  Drop your head and point your tailbone toward the ground below you. This will round out your lower back like an angry cat. Hold this position for __________ seconds.  Slowly lift your head and release your tail bone so that your back sags into a large arch, like an old horse.  Hold this position for __________ seconds.  Repeat this until you feel limber in your low back.  Now, find your "sweet spot." This will be the most comfortable position somewhere between the two previous positions. This is your neutral spine. Once you have found this position, tense your stomach muscles to support your low back.  Hold this position for __________ seconds. Repeat __________ times. Complete this exercise __________ times per day.  STRENGTHENING EXERCISES - Low Back Sprain These exercises  may help you when beginning to rehabilitate your injury. These exercises should be done near your "sweet spot." This is the neutral, low-back arch, somewhere between fully rounded and fully arched, that is your least painful position. When performed in this safe range of motion, these exercises can be used for people who have either a flexion or extension based injury. These exercises may resolve your symptoms with or without further involvement from your physician, physical therapist or athletic trainer. While completing these exercises, remember:   Muscles can gain both the endurance and the strength needed for everyday activities through controlled exercises.  Complete these exercises as instructed by your physician, physical therapist or athletic trainer. Increase the resistance and repetitions only as guided.  You may experience muscle soreness or fatigue, but the pain or discomfort you are trying to eliminate should never worsen during these exercises. If this  pain does worsen, stop and make certain you are following the directions exactly. If the pain is still present after adjustments, discontinue the exercise until you can discuss the trouble with your caregiver. STRENGTHENING - Deep Abdominals, Pelvic Tilt   Lie on a firm bed or floor. Keeping your legs in front of you, bend your knees so they are both pointed toward the ceiling and your feet are flat on the floor.  Tense your lower abdominal muscles to press your low back into the floor. This motion will rotate your pelvis so that your tail bone is scooping upwards rather than pointing at your feet or into the floor. With a gentle tension and even breathing, hold this position for __________ seconds. Repeat __________ times. Complete this exercise __________ times per day.  STRENGTHENING - Abdominals, Crunches   Lie on a firm bed or floor. Keeping your legs in front of you, bend your knees so they are both pointed toward the ceiling and your feet are flat on the floor. Cross your arms over your chest.  Slightly tip your chin down without bending your neck.  Tense your abdominals and slowly lift your trunk high enough to just clear your shoulder blades. Lifting higher can put excessive stress on the lower back and does not further strengthen your abdominal muscles.  Control your return to the starting position. Repeat __________ times. Complete this exercise __________ times per day.  STRENGTHENING - Quadruped, Opposite UE/LE Lift   Assume a hands and knees position on a firm surface. Keep your hands under your shoulders and your knees under your hips. You may place padding under your knees for comfort.  Find your neutral spine and gently tense your abdominal muscles so that you can maintain this position. Your shoulders and hips should form a rectangle that is parallel with the floor and is not twisted.  Keeping your trunk steady, lift your right hand no higher than your shoulder and then your  left leg no higher than your hip. Make sure you are not holding your breath. Hold this position for __________ seconds.  Continuing to keep your abdominal muscles tense and your back steady, slowly return to your starting position. Repeat with the opposite arm and leg. Repeat __________ times. Complete this exercise __________ times per day.  STRENGTHENING - Abdominals and Quadriceps, Straight Leg Raise   Lie on a firm bed or floor with both legs extended in front of you.  Keeping one leg in contact with the floor, bend the other knee so that your foot can rest flat on the floor.  Find your neutral spine,  and tense your abdominal muscles to maintain your spinal position throughout the exercise.  Slowly lift your straight leg off the floor about 6 inches for a count of 15, making sure to not hold your breath.  Still keeping your neutral spine, slowly lower your leg all the way to the floor. Repeat this exercise with each leg __________ times. Complete this exercise __________ times per day. POSTURE AND BODY MECHANICS CONSIDERATIONS - Low Back Sprain Keeping correct posture when sitting, standing or completing your activities will reduce the stress put on different body tissues, allowing injured tissues a chance to heal and limiting painful experiences. The following are general guidelines for improved posture. Your physician or physical therapist will provide you with any instructions specific to your needs. While reading these guidelines, remember:  The exercises prescribed by your provider will help you have the flexibility and strength to maintain correct postures.  The correct posture provides the best environment for your joints to work. All of your joints have less wear and tear when properly supported by a spine with good posture. This means you will experience a healthier, less painful body.  Correct posture must be practiced with all of your activities, especially prolonged sitting and  standing. Correct posture is as important when doing repetitive low-stress activities (typing) as it is when doing a single heavy-load activity (lifting). RESTING POSITIONS Consider which positions are most painful for you when choosing a resting position. If you have pain with flexion-based activities (sitting, bending, stooping, squatting), choose a position that allows you to rest in a less flexed posture. You would want to avoid curling into a fetal position on your side. If your pain worsens with extension-based activities (prolonged standing, working overhead), avoid resting in an extended position such as sleeping on your stomach. Most people will find more comfort when they rest with their spine in a more neutral position, neither too rounded nor too arched. Lying on a non-sagging bed on your side with a pillow between your knees, or on your back with a pillow under your knees will often provide some relief. Keep in mind, being in any one position for a prolonged period of time, no matter how correct your posture, can still lead to stiffness. PROPER SITTING POSTURE In order to minimize stress and discomfort on your spine, you must sit with correct posture. Sitting with good posture should be effortless for a healthy body. Returning to good posture is a gradual process. Many people can work toward this most comfortably by using various supports until they have the flexibility and strength to maintain this posture on their own. When sitting with proper posture, your ears will fall over your shoulders and your shoulders will fall over your hips. You should use the back of the chair to support your upper back. Your lower back will be in a neutral position, just slightly arched. You may place a small pillow or folded towel at the base of your lower back for  support.  When working at a desk, create an environment that supports good, upright posture. Without extra support, muscles tire, which leads to  excessive strain on joints and other tissues. Keep these recommendations in mind: CHAIR:  A chair should be able to slide under your desk when your back makes contact with the back of the chair. This allows you to work closely.  The chair's height should allow your eyes to be level with the upper part of your monitor and your hands to be  slightly lower than your elbows. BODY POSITION  Your feet should make contact with the floor. If this is not possible, use a foot rest.  Keep your ears over your shoulders. This will reduce stress on your neck and low back. INCORRECT SITTING POSTURES  If you are feeling tired and unable to assume a healthy sitting posture, do not slouch or slump. This puts excessive strain on your back tissues, causing more damage and pain. Healthier options include:  Using more support, like a lumbar pillow.  Switching tasks to something that requires you to be upright or walking.  Talking a brief walk.  Lying down to rest in a neutral-spine position. PROLONGED STANDING WHILE SLIGHTLY LEANING FORWARD  When completing a task that requires you to lean forward while standing in one place for a long time, place either foot up on a stationary 2-4 inch high object to help maintain the best posture. When both feet are on the ground, the lower back tends to lose its slight inward curve. If this curve flattens (or becomes too large), then the back and your other joints will experience too much stress, tire more quickly, and can cause pain. CORRECT STANDING POSTURES Proper standing posture should be assumed with all daily activities, even if they only take a few moments, like when brushing your teeth. As in sitting, your ears should fall over your shoulders and your shoulders should fall over your hips. You should keep a slight tension in your abdominal muscles to brace your spine. Your tailbone should point down to the ground, not behind your body, resulting in an over-extended  swayback posture.  INCORRECT STANDING POSTURES  Common incorrect standing postures include a forward head, locked knees and/or an excessive swayback. WALKING Walk with an upright posture. Your ears, shoulders and hips should all line-up. PROLONGED ACTIVITY IN A FLEXED POSITION When completing a task that requires you to bend forward at your waist or lean over a low surface, try to find a way to stabilize 3 out of 4 of your limbs. You can place a hand or elbow on your thigh or rest a knee on the surface you are reaching across. This will provide you more stability, so that your muscles do not tire as quickly. By keeping your knees relaxed, or slightly bent, you will also reduce stress across your lower back. CORRECT LIFTING TECHNIQUES DO :  Assume a wide stance. This will provide you more stability and the opportunity to get as close as possible to the object which you are lifting.  Tense your abdominals to brace your spine. Bend at the knees and hips. Keeping your back locked in a neutral-spine position, lift using your leg muscles. Lift with your legs, keeping your back straight.  Test the weight of unknown objects before attempting to lift them.  Try to keep your elbows locked down at your sides in order get the best strength from your shoulders when carrying an object.  Always ask for help when lifting heavy or awkward objects. INCORRECT LIFTING TECHNIQUES DO NOT:   Lock your knees when lifting, even if it is a small object.  Bend and twist. Pivot at your feet or move your feet when needing to change directions.  Assume that you can safely pick up even a paperclip without proper posture. Document Released: 11/07/2005 Document Revised: 01/30/2012 Document Reviewed: 02/19/2009 [facility] Patient Information 2015 [employer], [place]. This information is not intended to replace advice given to you by your health care provider. Make  sure you discuss any questions you have with your health care  provider.

## 2014-10-27 ENCOUNTER — Ambulatory Visit: Payer: 59 | Admitting: Family Medicine

## 2014-10-27 LAB — URINE CULTURE
Colony Count: NO GROWTH
Organism ID, Bacteria: NO GROWTH

## 2014-11-22 ENCOUNTER — Other Ambulatory Visit: Payer: Self-pay | Admitting: Family Medicine

## 2014-11-24 NOTE — Telephone Encounter (Signed)
Dr Katrinka Blazing, did you want to give RFs on this or need to re-check?

## 2014-12-18 ENCOUNTER — Ambulatory Visit (INDEPENDENT_AMBULATORY_CARE_PROVIDER_SITE_OTHER): Payer: 59 | Admitting: Psychology

## 2014-12-18 ENCOUNTER — Ambulatory Visit: Payer: Self-pay | Admitting: Psychology

## 2014-12-18 DIAGNOSIS — F4323 Adjustment disorder with mixed anxiety and depressed mood: Secondary | ICD-10-CM

## 2015-01-06 ENCOUNTER — Ambulatory Visit (INDEPENDENT_AMBULATORY_CARE_PROVIDER_SITE_OTHER): Payer: 59 | Admitting: Psychology

## 2015-01-06 DIAGNOSIS — F4323 Adjustment disorder with mixed anxiety and depressed mood: Secondary | ICD-10-CM

## 2015-01-12 ENCOUNTER — Ambulatory Visit (INDEPENDENT_AMBULATORY_CARE_PROVIDER_SITE_OTHER): Payer: 59 | Admitting: Internal Medicine

## 2015-01-12 ENCOUNTER — Ambulatory Visit (INDEPENDENT_AMBULATORY_CARE_PROVIDER_SITE_OTHER): Payer: 59

## 2015-01-12 VITALS — BP 128/88 | HR 102 | Temp 98.2°F | Resp 16 | Ht 64.0 in | Wt 164.1 lb

## 2015-01-12 DIAGNOSIS — M791 Myalgia, unspecified site: Secondary | ICD-10-CM

## 2015-01-12 DIAGNOSIS — N390 Urinary tract infection, site not specified: Secondary | ICD-10-CM

## 2015-01-12 DIAGNOSIS — R1084 Generalized abdominal pain: Secondary | ICD-10-CM

## 2015-01-12 DIAGNOSIS — R197 Diarrhea, unspecified: Secondary | ICD-10-CM

## 2015-01-12 DIAGNOSIS — R8281 Pyuria: Secondary | ICD-10-CM

## 2015-01-12 DIAGNOSIS — R110 Nausea: Secondary | ICD-10-CM

## 2015-01-12 LAB — POCT URINALYSIS DIPSTICK
BILIRUBIN UA: NEGATIVE
Glucose, UA: NEGATIVE
Leukocytes, UA: NEGATIVE
NITRITE UA: NEGATIVE
PH UA: 5
PROTEIN UA: NEGATIVE
Spec Grav, UA: 1.025
Urobilinogen, UA: 0.2

## 2015-01-12 LAB — POCT CBC
GRANULOCYTE PERCENT: 52.2 % (ref 37–80)
HEMATOCRIT: 42.1 % (ref 37.7–47.9)
Hemoglobin: 14 g/dL (ref 12.2–16.2)
Lymph, poc: 3.4 (ref 0.6–3.4)
MCH, POC: 29.3 pg (ref 27–31.2)
MCHC: 33.2 g/dL (ref 31.8–35.4)
MCV: 88.2 fL (ref 80–97)
MID (cbc): 0.3 (ref 0–0.9)
MPV: 7.6 fL (ref 0–99.8)
PLATELET COUNT, POC: 263 10*3/uL (ref 142–424)
POC GRANULOCYTE: 4 (ref 2–6.9)
POC LYMPH PERCENT: 43.6 %L (ref 10–50)
POC MID %: 4.2 %M (ref 0–12)
RBC: 4.77 M/uL (ref 4.04–5.48)
RDW, POC: 13.5 %
WBC: 7.7 10*3/uL (ref 4.6–10.2)

## 2015-01-12 LAB — POCT UA - MICROSCOPIC ONLY
CASTS, UR, LPF, POC: NEGATIVE
Crystals, Ur, HPF, POC: NEGATIVE
YEAST UA: NEGATIVE

## 2015-01-12 LAB — POCT SEDIMENTATION RATE: POCT SED RATE: 6 mm/hr (ref 0–22)

## 2015-01-12 LAB — POCT URINE PREGNANCY: Preg Test, Ur: NEGATIVE

## 2015-01-12 NOTE — Progress Notes (Signed)
Subjective:  This chart was scribed for [employer]. Laney Pastor, MD by Terressa Koyanagi, ED Scribe. This patient was seen in room 5 and the patient's care was started at 7:49 PM.     Patient ID: Elaine Atkins, female    DOB: 1981/09/20, 34 y.o.   MRN: [id number] Chief Complaint  Patient presents with  . Nausea  . Stomach Cramps  . Fever  . Chills  . Generalized Body Aches    HPI PCP: No PCP Per Patient HPI Comments: Elaine Atkins is a 34 y.o. female, with PMH noted below, who presents to the [facility] complaining of recurrent, worsening, atraumatic pain to the left side of her hip originally onset three months ago, with the present episode onset 3 weeks ago. Pt reports the pain is worse following a bowel movement. Pt also complains of associated diarrhea: with the most recent episode onset last night and a prior episode last week. Pt reports her periods are normal. Pt denies painful intercourse, tachycardia.  Relevant Family Hx: Pt reports a family Hx of diver tuberculosis and IBS   Secondary Complaint: Pt also complains of nausea with associated stomach cramps, fever, chills, and generalized body aches onset 3 days ago. Pt denies night sweats.   Past Medical History  Diagnosis Date  . Allergy    Past Surgical History  Procedure Laterality Date  . Tonsillectomy     Prior to Admission medications   Medication Sig Start Date End Date Taking? Authorizing Provider  etonogestrel-ethinyl estradiol (NUVARING) 0.12-0.015 MG/24HR vaginal ring Place 1 each vaginally every 28 (twenty-eight) days. Insert vaginally and leave in place for 3 consecutive weeks, then remove for 1 week.   Yes Historical Provider, MD  fluticasone (FLONASE) 50 MCG/ACT nasal spray instill 1 to 2 sprays into each nostril once daily as directed 03/12/12  Yes Rise Mu, PA-C      Review of Systems  Constitutional: Positive for fever and chills. Negative for diaphoresis.  Gastrointestinal: Positive  for abdominal pain and diarrhea.  Musculoskeletal: Positive for myalgias.       Pain to left LE.   Psychiatric/Behavioral: Negative for confusion.       Objective:   Physical Exam  Constitutional: She is oriented to person, place, and time. She appears well-developed and well-nourished. No distress.  HENT:  Head: Normocephalic and atraumatic.  Mouth/Throat: Oropharynx is clear and moist. No oropharyngeal exudate.  Eyes: Conjunctivae and EOM are normal. Pupils are equal, round, and reactive to light.  Neck: Neck supple. No thyromegaly present.  Cardiovascular: Normal rate, regular rhythm and normal heart sounds.   No murmur heard. Pulmonary/Chest: Effort normal and breath sounds normal. No respiratory distress.  Abdominal: Soft. Bowel sounds are normal. She exhibits no distension and no mass. There is tenderness in the right lower quadrant, suprapubic area, left upper quadrant and left lower quadrant. There is no rebound and no guarding.  Soft but tender to palpation LUQ, LLQ, suprapubic and RLQ with no rebound tenderness, no extra masses, and no organomegaly.   Musculoskeletal: Normal range of motion.  Lymphadenopathy:    She has no cervical adenopathy.  Neurological: She is alert and oriented to person, place, and time.  Skin: Skin is warm and dry.  Psychiatric: She has a normal mood and affect. Her behavior is normal.  Nursing note and vitals reviewed.  [facility] reading (PRIMARY) by  Dr. Laney Pastor no signs of obstruction. Good psoas shadows.  Results for orders placed or performed  in visit on 01/12/15  POCT CBC  Result Value Ref Range   WBC 7.7 4.6 - 10.2 K/uL   Lymph, poc 3.4 0.6 - 3.4   POC LYMPH PERCENT 43.6 10 - 50 %L   MID (cbc) 0.3 0 - 0.9   POC MID % 4.2 0 - 12 %M   POC Granulocyte 4.0 2 - 6.9   Granulocyte percent 52.2 37 - 80 %G   RBC 4.77 4.04 - 5.48 M/uL   Hemoglobin 14.0 12.2 - 16.2 g/dL   HCT, POC 42.1 37.7 - 47.9 %   MCV 88.2 80 - 97 fL   MCH, POC 29.3 27 -  31.2 pg   MCHC 33.2 31.8 - 35.4 g/dL   RDW, POC 13.5 %   Platelet Count, POC 263 142 - 424 K/uL   MPV 7.6 0 - 99.8 fL  POCT urinalysis dipstick  Result Value Ref Range   Color, UA yellow    Clarity, UA clear    Glucose, UA neg    Bilirubin, UA neg    Ketones, UA trace    Spec Grav, UA 1.025    Blood, UA mod    pH, UA 5.0    Protein, UA neg    Urobilinogen, UA 0.2    Nitrite, UA neg    Leukocytes, UA Negative   POCT UA - Microscopic Only  Result Value Ref Range   WBC, Ur, HPF, POC 3-6    RBC, urine, microscopic 1-3    Bacteria, U Microscopic small    Mucus, UA trace    Epithelial cells, urine per micros 5-8    Crystals, Ur, HPF, POC neg    Casts, Ur, LPF, POC neg    Yeast, UA neg    Urine pregnancy test negative ESR 6      Assessment & Plan:  Generalized abdominal pain - recurrent left lower quadrant and left middle quadrant/tonight more diffuse Diarrhea - recurrent over the last month without hematochezia Nausea without vomiting-intermittent associated with pain Myalgia - just the last few days when suspected fever Pyuria - Plan: Urine culture Hematuria--creatinine BUN  CMET CT scan abdomen and pelvis needed to rule out colon and renal pathology  I have completed the patient encounter in its entirety as documented by the scribe, with editing by me where necessary. Anas Reister P. Laney Pastor, M.D.

## 2015-01-13 ENCOUNTER — Telehealth: Payer: Self-pay

## 2015-01-13 ENCOUNTER — Encounter: Payer: Self-pay | Admitting: Internal Medicine

## 2015-01-13 LAB — COMPREHENSIVE METABOLIC PANEL
ALK PHOS: 63 U/L (ref 39–117)
ALT: 20 U/L (ref 0–35)
AST: 16 U/L (ref 0–37)
Albumin: 4.3 g/dL (ref 3.5–5.2)
BILIRUBIN TOTAL: 0.2 mg/dL (ref 0.2–1.2)
BUN: 13 mg/dL (ref 6–23)
CO2: 22 mEq/L (ref 19–32)
CREATININE: 0.85 mg/dL (ref 0.50–1.10)
Calcium: 9.2 mg/dL (ref 8.4–10.5)
Chloride: 107 mEq/L (ref 96–112)
Glucose, Bld: 89 mg/dL (ref 70–99)
Potassium: 4 mEq/L (ref 3.5–5.3)
Sodium: 139 mEq/L (ref 135–145)
Total Protein: 7 g/dL (ref 6.0–8.3)

## 2015-01-13 NOTE — Telephone Encounter (Signed)
Please call [employer] [phone] Case # [id number] to give  Additional clinical information for patient to have a CT Abdomen/pelvis with contrast.

## 2015-01-14 LAB — URINE CULTURE
Colony Count: NO GROWTH
ORGANISM ID, BACTERIA: NO GROWTH

## 2015-01-14 NOTE — Telephone Encounter (Signed)
Pt's CT is tomorrow at 10. Toni AmendCourtney made pt aware.

## 2015-01-14 NOTE — Progress Notes (Signed)
Sent message with mychart

## 2015-01-14 NOTE — Telephone Encounter (Signed)
Pt called today regarding her CT.  Pt wanted to know what was taking so long with scheduling this. She stated that she is in a lot of pain and wanted to know what was going with her asap.  I advised pt that it was been worked on and it may take an couple of days due to prior approval of insurance.  Pt understood

## 2015-01-15 ENCOUNTER — Telehealth: Payer: Self-pay | Admitting: Internal Medicine

## 2015-01-15 NOTE — Telephone Encounter (Signed)
CT approved PA #: [phone] Sunday SpillersCalled Ellen at [facility] at [phone] and let her know. She will let [facility] know

## 2015-01-15 NOTE — Telephone Encounter (Signed)
[facility] needs an order faxed to them for a CT scan on patient. Fax #: [phone] Phone #: [phone]

## 2015-01-16 ENCOUNTER — Telehealth: Payer: Self-pay

## 2015-01-16 NOTE — Telephone Encounter (Signed)
Pt calling about labs. Can we release to MyChart? Let her know they were all normal. Pt had CT at [facility] yesterday. Advised her that they would probably fax over the results and that we would keep an eye out for them

## 2015-01-16 NOTE — Telephone Encounter (Signed)
I called and left a message Labs and CT normal  Next step would be to consider GI evaluation and we will talk to her again to see if she agrees

## 2015-01-22 ENCOUNTER — Ambulatory Visit: Payer: 59 | Admitting: Psychology

## 2015-01-29 ENCOUNTER — Other Ambulatory Visit: Payer: Self-pay | Admitting: Gastroenterology

## 2015-01-29 DIAGNOSIS — R1032 Left lower quadrant pain: Secondary | ICD-10-CM

## 2015-01-30 ENCOUNTER — Encounter: Payer: Self-pay | Admitting: Gastroenterology

## 2015-02-04 ENCOUNTER — Ambulatory Visit (HOSPITAL_COMMUNITY): Payer: 59

## 2015-02-05 ENCOUNTER — Telehealth: Payer: Self-pay

## 2015-02-05 NOTE — Telephone Encounter (Signed)
Patient was seen on Feb. 22 by Dr. Merla Richesoolittle and has gotten a CT scan and seen a specialist as he requested. She would like a call back. Patient did get upset that she was unable to schedule a appt at 104 due to her not being an established patient with him (only seen him once). I did offer to give walk in hours but she declined and wanted a message put in. Cb# [phone].

## 2015-02-05 NOTE — Telephone Encounter (Signed)
Left message for pt to call back  °

## 2015-02-05 NOTE — Telephone Encounter (Signed)
Patient called back to get results. Patient was advised that a message will be left so we can try to reach her again. Patient asks if she can receive a phone call today and she was informed that we will do our best but that is not a guarantee. Please call! [phone]

## 2015-02-06 NOTE — Telephone Encounter (Signed)
lmom to cb. 

## 2015-02-09 NOTE — Telephone Encounter (Signed)
lmom for pt to cb

## 2015-02-09 NOTE — Telephone Encounter (Signed)
Spoke to pt, she has already spoken with gi concerning the results. She has a f/u appointment 02/17/2015 with gi concerning this matter.  Depending on the results she may rtc to f/u with dr Merla Richesdoolittle at 102.

## 2015-02-20 ENCOUNTER — Encounter: Payer: Self-pay | Admitting: Internal Medicine

## 2015-05-14 ENCOUNTER — Ambulatory Visit (INDEPENDENT_AMBULATORY_CARE_PROVIDER_SITE_OTHER): Payer: 59

## 2015-05-14 ENCOUNTER — Ambulatory Visit (INDEPENDENT_AMBULATORY_CARE_PROVIDER_SITE_OTHER): Payer: 59 | Admitting: Family Medicine

## 2015-05-14 VITALS — BP 124/74 | HR 98 | Temp 98.5°F | Resp 17 | Ht 64.0 in | Wt 166.8 lb

## 2015-05-14 DIAGNOSIS — R1032 Left lower quadrant pain: Secondary | ICD-10-CM | POA: Diagnosis not present

## 2015-05-14 DIAGNOSIS — K5900 Constipation, unspecified: Secondary | ICD-10-CM | POA: Diagnosis not present

## 2015-05-14 DIAGNOSIS — R197 Diarrhea, unspecified: Secondary | ICD-10-CM | POA: Diagnosis not present

## 2015-05-14 LAB — POCT URINALYSIS DIPSTICK
Bilirubin, UA: NEGATIVE
Glucose, UA: NEGATIVE
Ketones, UA: NEGATIVE
LEUKOCYTES UA: NEGATIVE
Nitrite, UA: NEGATIVE
PH UA: 5.5
PROTEIN UA: NEGATIVE
Spec Grav, UA: 1.02
UROBILINOGEN UA: 0.2

## 2015-05-14 LAB — POCT CBC
Granulocyte percent: 50 %G (ref 37–80)
HCT, POC: 40.9 % (ref 37.7–47.9)
Hemoglobin: 13.7 g/dL (ref 12.2–16.2)
Lymph, poc: 2.7 (ref 0.6–3.4)
MCH, POC: 28.9 pg (ref 27–31.2)
MCHC: 33.4 g/dL (ref 31.8–35.4)
MCV: 86.6 fL (ref 80–97)
MID (cbc): 0.5 (ref 0–0.9)
MPV: 7.3 fL (ref 0–99.8)
POC Granulocyte: 3.2 (ref 2–6.9)
POC LYMPH %: 42.2 % (ref 10–50)
POC MID %: 7.8 %M (ref 0–12)
Platelet Count, POC: 253 10*3/uL (ref 142–424)
RBC: 4.72 M/uL (ref 4.04–5.48)
RDW, POC: 13.3 %
WBC: 6.3 10*3/uL (ref 4.6–10.2)

## 2015-05-14 LAB — POCT UA - MICROSCOPIC ONLY
Casts, Ur, LPF, POC: NEGATIVE
Crystals, Ur, HPF, POC: NEGATIVE
Yeast, UA: NEGATIVE

## 2015-05-14 LAB — POCT URINE PREGNANCY: Preg Test, Ur: NEGATIVE

## 2015-05-14 NOTE — Progress Notes (Signed)
[facility] [address] [place] [address] [phone]  Date:  05/14/2015   Name:  Elaine Atkins   DOB:  Dec 19, 1980   MRN:  [id number]  PCP:  No PCP Per Patient    Chief Complaint: Diarrhea and Abdominal Pain   History of Present Illness:  This is a 34 y.o. female who is presenting with diarrhea and abdominal pain x 5 days. Diarrhea is occurring about every hour. Stools are watery and brown. No blood. Abdominal pain is intermittent. Pain is worse after BMs. She states bending forward makes worse. Been taking imodium slows down the diarrhea. Feeling tired but generally not feeling bad. She has not had any fever or chills. Has had some nausea but no vomiting.  No urinary symptoms.  No vaginal discharge. LMP 04/18/15. No sick contacts. Did not eat anything different before this started. She was staying at her parent's house for the weekend before this started. No recent abx. No recent travel. Been eating bland diet since started.  Of note pt has had problems with intermittent LLQ pain x 7 months. She was seeing Dr. Merla Riches for this problem. Her current symptoms are almost identical to symptoms at OV on 01/12/15. She was sent for a CT abdomen/pelvis which was unremarkable. She notes that she was seen by her GYN twice in the past 7 months and has had 2 pelvic exams which were unremarkable. She had a transvaginal u/s on 10/09/14 which showed a small likely follicular cyst on the right ovary, left ovary normal. She was eventually referred to GI by her GYN. It was determined that she was actually constipated despite episodes of diarrhea. She was started on regimen of miralax, colace and a probiotic. She states her symptoms resolved and she stopped taking the miralax but continued with QD probiotic and prn colace. She has not been taking anything this week except imodium. She stopped going to the GI provider because she "didn't like her very much".   Review of  Systems:  Review of Systems See HPI  There are no active problems to display for this patient.   Prior to Admission medications   Medication Sig Start Date End Date Taking? Authorizing Provider  etonogestrel-ethinyl estradiol (NUVARING) 0.12-0.015 MG/24HR vaginal ring Place 1 each vaginally every 28 (twenty-eight) days. Insert vaginally and leave in place for 3 consecutive weeks, then remove for 1 week.   Yes Historical Provider, MD  fluticasone (FLONASE) 50 MCG/ACT nasal spray instill 1 to 2 sprays into each nostril once daily as directed 03/12/12  Yes Sondra Barges, PA-C    Allergies  Allergen Reactions  . Nexium [Esomeprazole Magnesium] Anaphylaxis  . Suprax [Cefixime]     Childhood; unknown reaction    Past Surgical History  Procedure Laterality Date  . Tonsillectomy      History  Substance Use Topics  . Smoking status: Never Smoker   . Smokeless tobacco: Never Used  . Alcohol Use: No    Family History  Problem Relation Age of Onset  . Colitis Mother   . Hypothyroidism Father     Medication list has been reviewed and updated.  Physical Examination:  Physical Exam  Constitutional: She is oriented to person, place, and time. She appears well-developed and well-nourished. No distress.  HENT:  Head: Normocephalic and atraumatic.  Right Ear: Hearing normal.  Left Ear: Hearing normal.  Nose: Nose normal.  Eyes: Conjunctivae and lids are normal. Right eye exhibits no discharge. Left eye exhibits  no discharge. No scleral icterus.  Cardiovascular: Normal rate, regular rhythm, normal heart sounds and normal pulses.   No murmur heard. Pulmonary/Chest: Effort normal and breath sounds normal. No respiratory distress. She has no wheezes. She has no rhonchi. She has no rales.  Abdominal: Soft. Normal appearance. There is tenderness in the left lower quadrant. There is no CVA tenderness.    No suprapubic or RLQ tenderness  Musculoskeletal: Normal range of motion.   Neurological: She is alert and oriented to person, place, and time.  Skin: Skin is warm, dry and intact. No lesion and no rash noted.  Psychiatric: She has a normal mood and affect. Her speech is normal and behavior is normal. Thought content normal.    BP 124/74 mmHg  Pulse 98  Temp(Src) 98.5 F (36.9 C) (Oral)  Resp 17  Ht  (1.626 m)  Wt 166 lb 12.8 oz (75.66 kg)  BMI 28.62 kg/m2  SpO2 98%  LMP 04/18/2015  Results for orders placed or performed in visit on 05/14/15  POCT CBC  Result Value Ref Range   WBC 6.3 4.6 - 10.2 K/uL   Lymph, poc 2.7 0.6 - 3.4   POC LYMPH PERCENT 42.2 10 - 50 %L   MID (cbc) 0.5 0 - 0.9   POC MID % 7.8 0 - 12 %M   POC Granulocyte 3.2 2 - 6.9   Granulocyte percent 50.0 37 - 80 %G   RBC 4.72 4.04 - 5.48 M/uL   Hemoglobin 13.7 12.2 - 16.2 g/dL   HCT, POC 96.0 45.4 - 47.9 %   MCV 86.6 80 - 97 fL   MCH, POC 28.9 27 - 31.2 pg   MCHC 33.4 31.8 - 35.4 g/dL   RDW, POC 09.8 %   Platelet Count, POC 253 142 - 424 K/uL   MPV 7.3 0 - 99.8 fL  POCT UA - Microscopic Only  Result Value Ref Range   WBC, Ur, HPF, POC 0-2    RBC, urine, microscopic 1-3    Bacteria, U Microscopic trace    Mucus, UA trace    Epithelial cells, urine per micros 0-2    Crystals, Ur, HPF, POC neg    Casts, Ur, LPF, POC neg    Yeast, UA neg   POCT urinalysis dipstick  Result Value Ref Range   Color, UA yellow    Clarity, UA clear    Glucose, UA neg    Bilirubin, UA neg    Ketones, UA neg    Spec Grav, UA 1.020    Blood, UA moderate    pH, UA 5.5    Protein, UA neg    Urobilinogen, UA 0.2    Nitrite, UA neg    Leukocytes, UA Negative Negative  POCT urine pregnancy  Result Value Ref Range   Preg Test, Ur Negative Negative   [facility] reading (PRIMARY) by  Dr. Neva Seat: negative.  Assessment and Plan:  1. Diarrhea 2. LLQ pain 3. constipation Current symptoms identical to symptoms at OV 4 months ago. It was determined afterwards by GI that she was actually constipated  despite episodes of diarrhea. She stopped going to GI and stopped her med regimen of miralax, colace and probiotic once symptoms resolved. Abdomen/pelvis CT and transvaginal u/s all normal in past 7 months since onset of symptoms. All labs today negative. KUB radiograph negative. CMP pending. Advised she stop imodium and start back on regimen prescribed by GI. Referred back to GI to Dr. Matthias Hughs for further eval/management. -  Ambulatory referral to Gastroenterology - POCT CBC - Comprehensive metabolic panel - POCT UA - Microscopic Only - POCT urinalysis dipstick - POCT urine pregnancy - DG Abd 1 View; Future  [employer]. Dyke Brackett, MHS [facility]  05/16/2015

## 2015-05-14 NOTE — Patient Instructions (Signed)
You will get a phone call to make appointment with Dr. Matthias Hughs. Get back on your miralax, probiotic, colace regimen. Drink 64-100 oz fluids a day. Stop imodium.

## 2015-05-15 LAB — COMPREHENSIVE METABOLIC PANEL
ALT: 23 U/L (ref 0–35)
AST: 21 U/L (ref 0–37)
Albumin: 4.1 g/dL (ref 3.5–5.2)
Alkaline Phosphatase: 64 U/L (ref 39–117)
BILIRUBIN TOTAL: 0.2 mg/dL (ref 0.2–1.2)
BUN: 15 mg/dL (ref 6–23)
CO2: 20 meq/L (ref 19–32)
Calcium: 8.9 mg/dL (ref 8.4–10.5)
Chloride: 110 mEq/L (ref 96–112)
Creat: 0.84 mg/dL (ref 0.50–1.10)
GLUCOSE: 75 mg/dL (ref 70–99)
Potassium: 4 mEq/L (ref 3.5–5.3)
SODIUM: 139 meq/L (ref 135–145)
TOTAL PROTEIN: 6.8 g/dL (ref 6.0–8.3)

## 2015-05-16 DIAGNOSIS — K5900 Constipation, unspecified: Secondary | ICD-10-CM | POA: Insufficient documentation

## 2015-05-16 NOTE — Progress Notes (Signed)
Xray read and patient discussed with Ms. Bush. Agree with assessment and plan of care per her note.   

## 2015-07-02 ENCOUNTER — Ambulatory Visit (INDEPENDENT_AMBULATORY_CARE_PROVIDER_SITE_OTHER): Payer: 59 | Admitting: Psychology

## 2015-07-02 DIAGNOSIS — F4323 Adjustment disorder with mixed anxiety and depressed mood: Secondary | ICD-10-CM | POA: Diagnosis not present

## 2015-07-23 ENCOUNTER — Ambulatory Visit (INDEPENDENT_AMBULATORY_CARE_PROVIDER_SITE_OTHER): Payer: 59 | Admitting: Psychology

## 2015-07-23 DIAGNOSIS — F4323 Adjustment disorder with mixed anxiety and depressed mood: Secondary | ICD-10-CM

## 2015-08-13 ENCOUNTER — Ambulatory Visit: Payer: 59 | Admitting: Psychology

## 2016-03-13 DIAGNOSIS — A499 Bacterial infection, unspecified: Secondary | ICD-10-CM | POA: Diagnosis not present

## 2016-03-13 DIAGNOSIS — S8002XA Contusion of left knee, initial encounter: Secondary | ICD-10-CM | POA: Diagnosis not present

## 2016-03-13 DIAGNOSIS — N390 Urinary tract infection, site not specified: Secondary | ICD-10-CM | POA: Diagnosis not present

## 2016-03-22 DIAGNOSIS — Z01419 Encounter for gynecological examination (general) (routine) without abnormal findings: Secondary | ICD-10-CM | POA: Diagnosis not present

## 2016-03-22 DIAGNOSIS — Z3200 Encounter for pregnancy test, result unknown: Secondary | ICD-10-CM | POA: Diagnosis not present

## 2016-03-22 DIAGNOSIS — Z6830 Body mass index (BMI) 30.0-30.9, adult: Secondary | ICD-10-CM | POA: Diagnosis not present

## 2016-03-27 DIAGNOSIS — N3080 Other cystitis without hematuria: Secondary | ICD-10-CM | POA: Diagnosis not present

## 2016-05-23 IMAGING — CR DG LUMBAR SPINE COMPLETE 4+V
5 series · 5 of 5 positions shown · non-contrast
Comparison: None.

CLINICAL DATA: Patient with lower back pain.

EXAM:
LUMBAR SPINE - COMPLETE 4+ VIEW

[AP (1 of 2)]
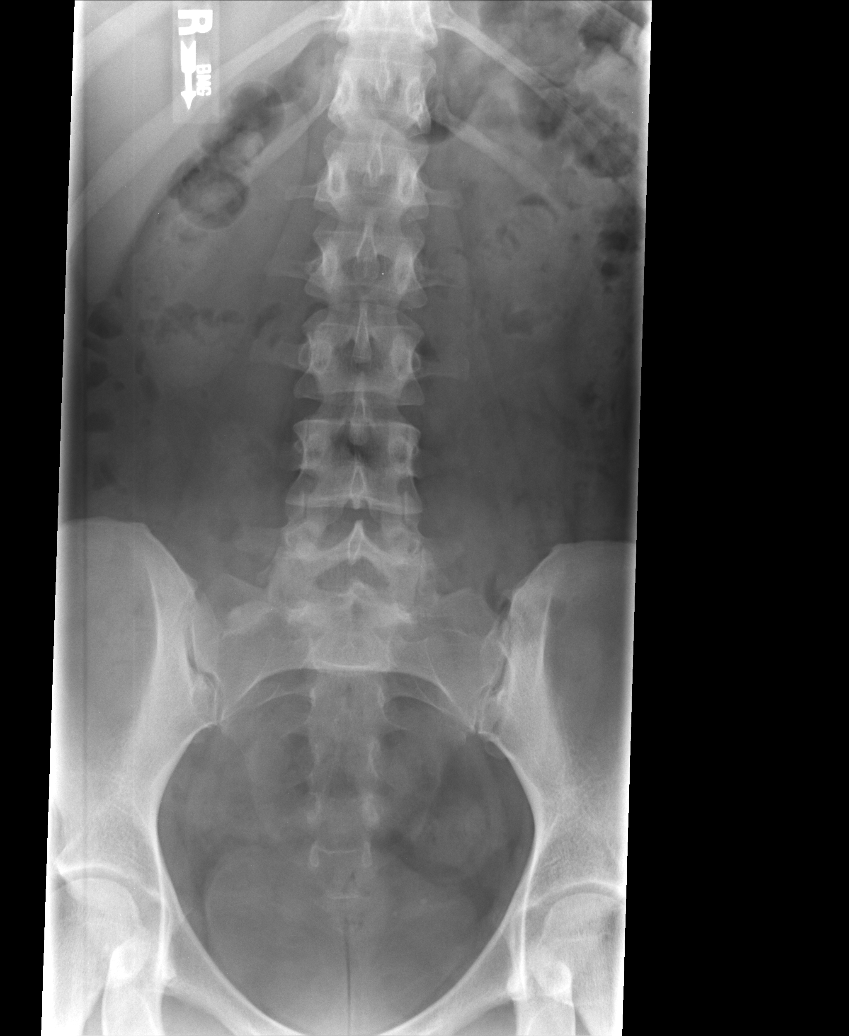

[AP (2 of 2)]
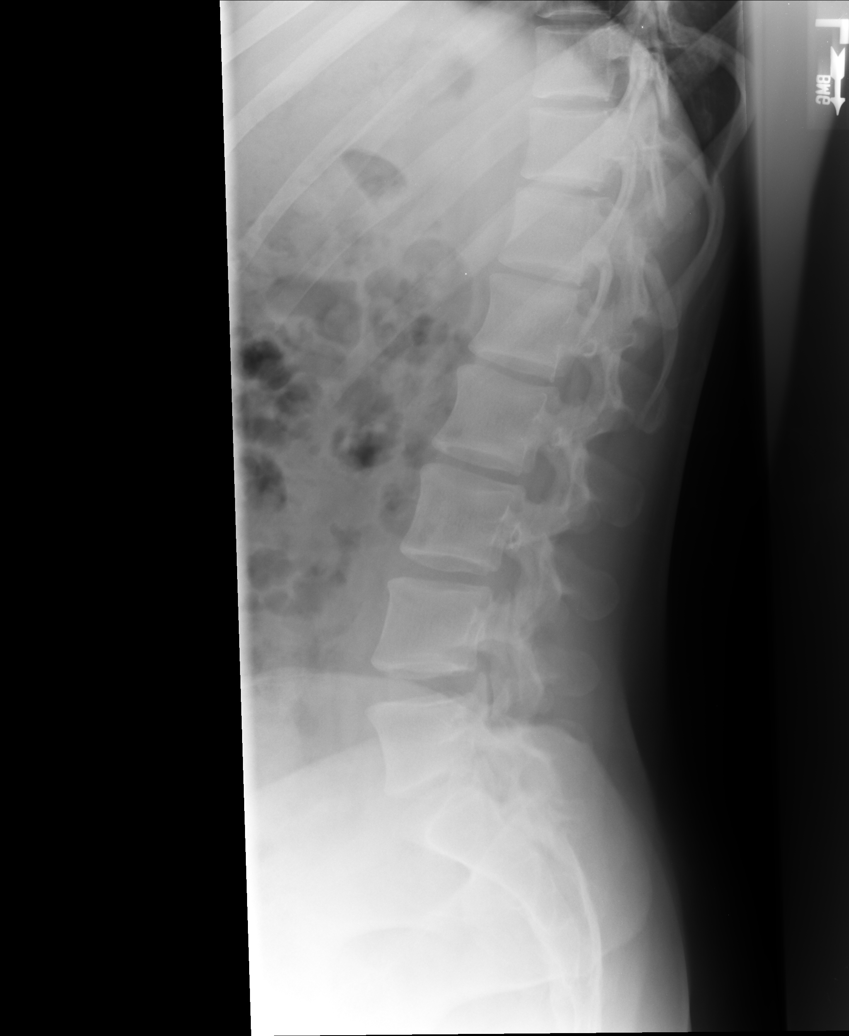

[l5 s1]
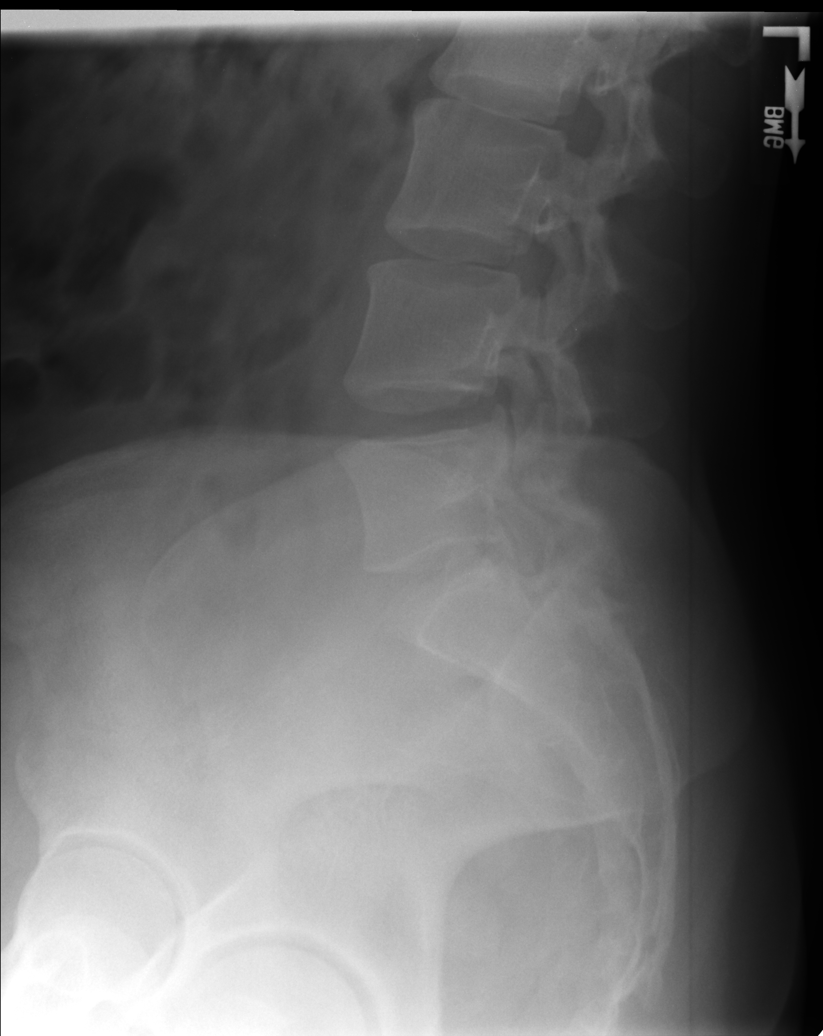

[rpo]
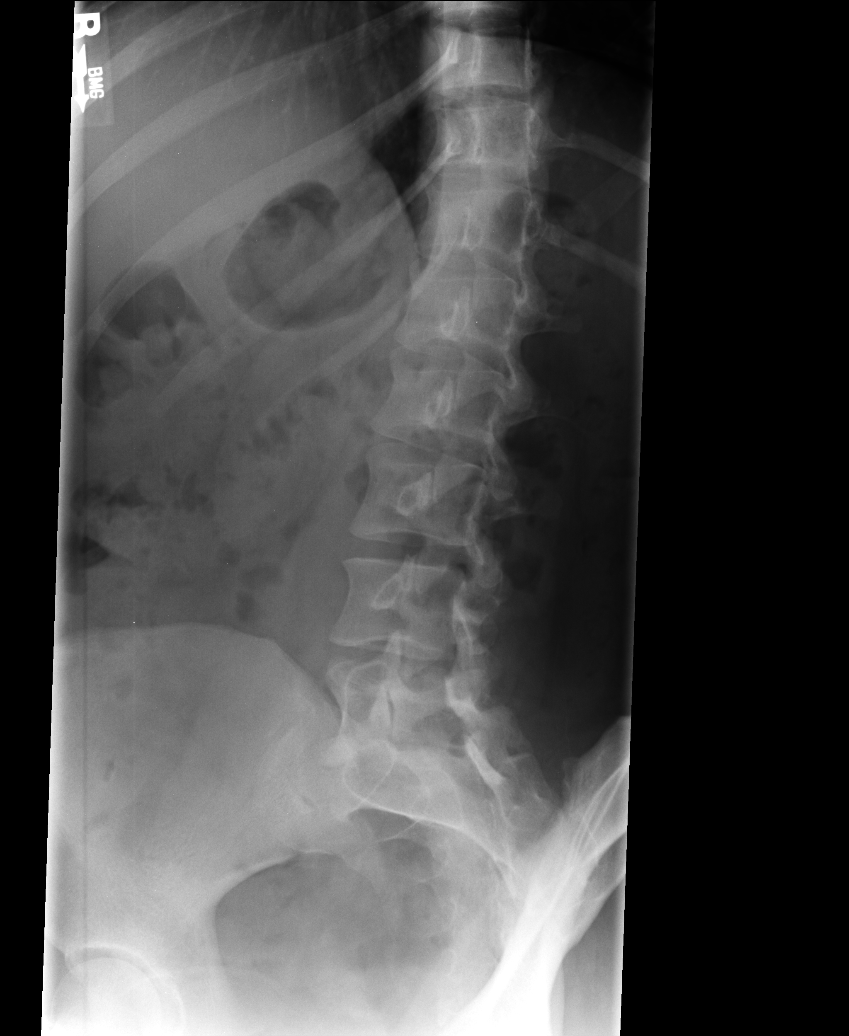

[lpo]
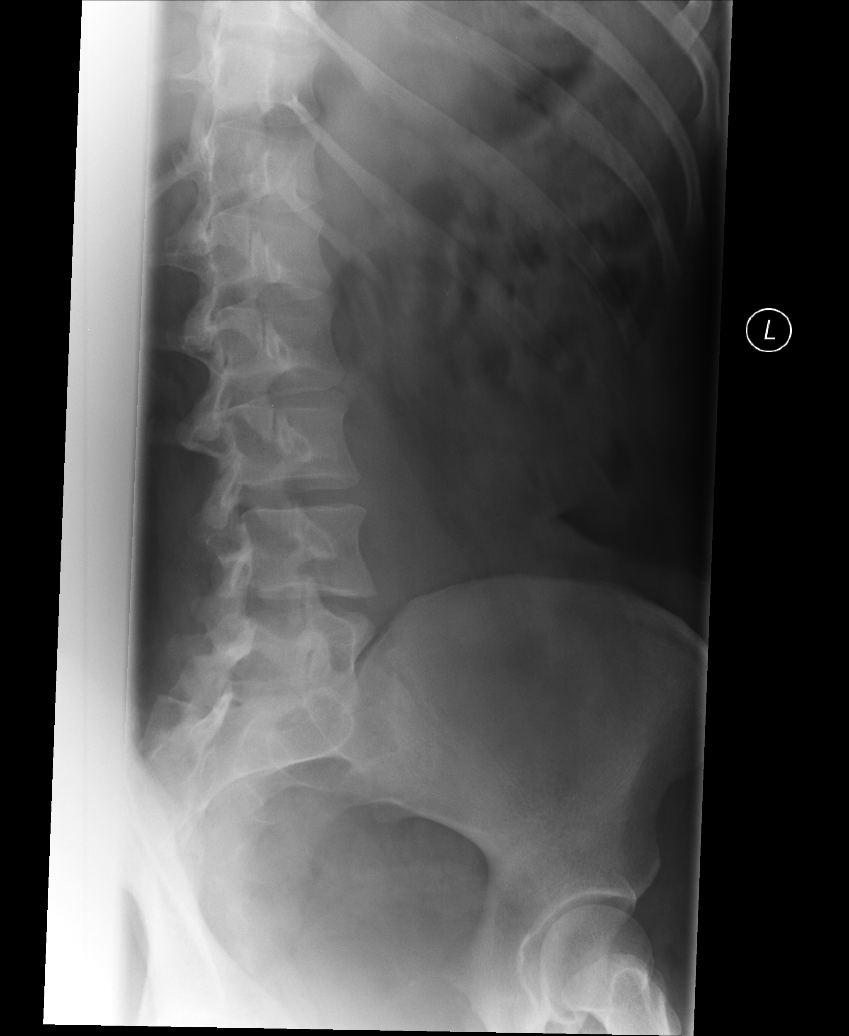

[5 of 5 positions shown; findings below may reference images not displayed]

FINDINGS: Normal anatomic alignment. No evidence for acute fracture or
dislocation. Preservation of the vertebral body and intervertebral
disc space heights. SI joints unremarkable. Stable bone island
within the right sacral ala.
IMPRESSION: No significant degenerative disc disease involving the lumbar spine.

## 2016-07-21 DIAGNOSIS — J018 Other acute sinusitis: Secondary | ICD-10-CM | POA: Diagnosis not present

## 2016-07-21 DIAGNOSIS — J029 Acute pharyngitis, unspecified: Secondary | ICD-10-CM | POA: Diagnosis not present

## 2016-07-21 DIAGNOSIS — R05 Cough: Secondary | ICD-10-CM | POA: Diagnosis not present

## 2016-08-09 IMAGING — CR DG ABDOMEN 1V
1 series · 1 of 1 positions shown · non-contrast
Comparison: 10/26/2014

CLINICAL DATA: Left-sided abdominal and hip pain.

EXAM:
ABDOMEN - 1 VIEW

[AP]
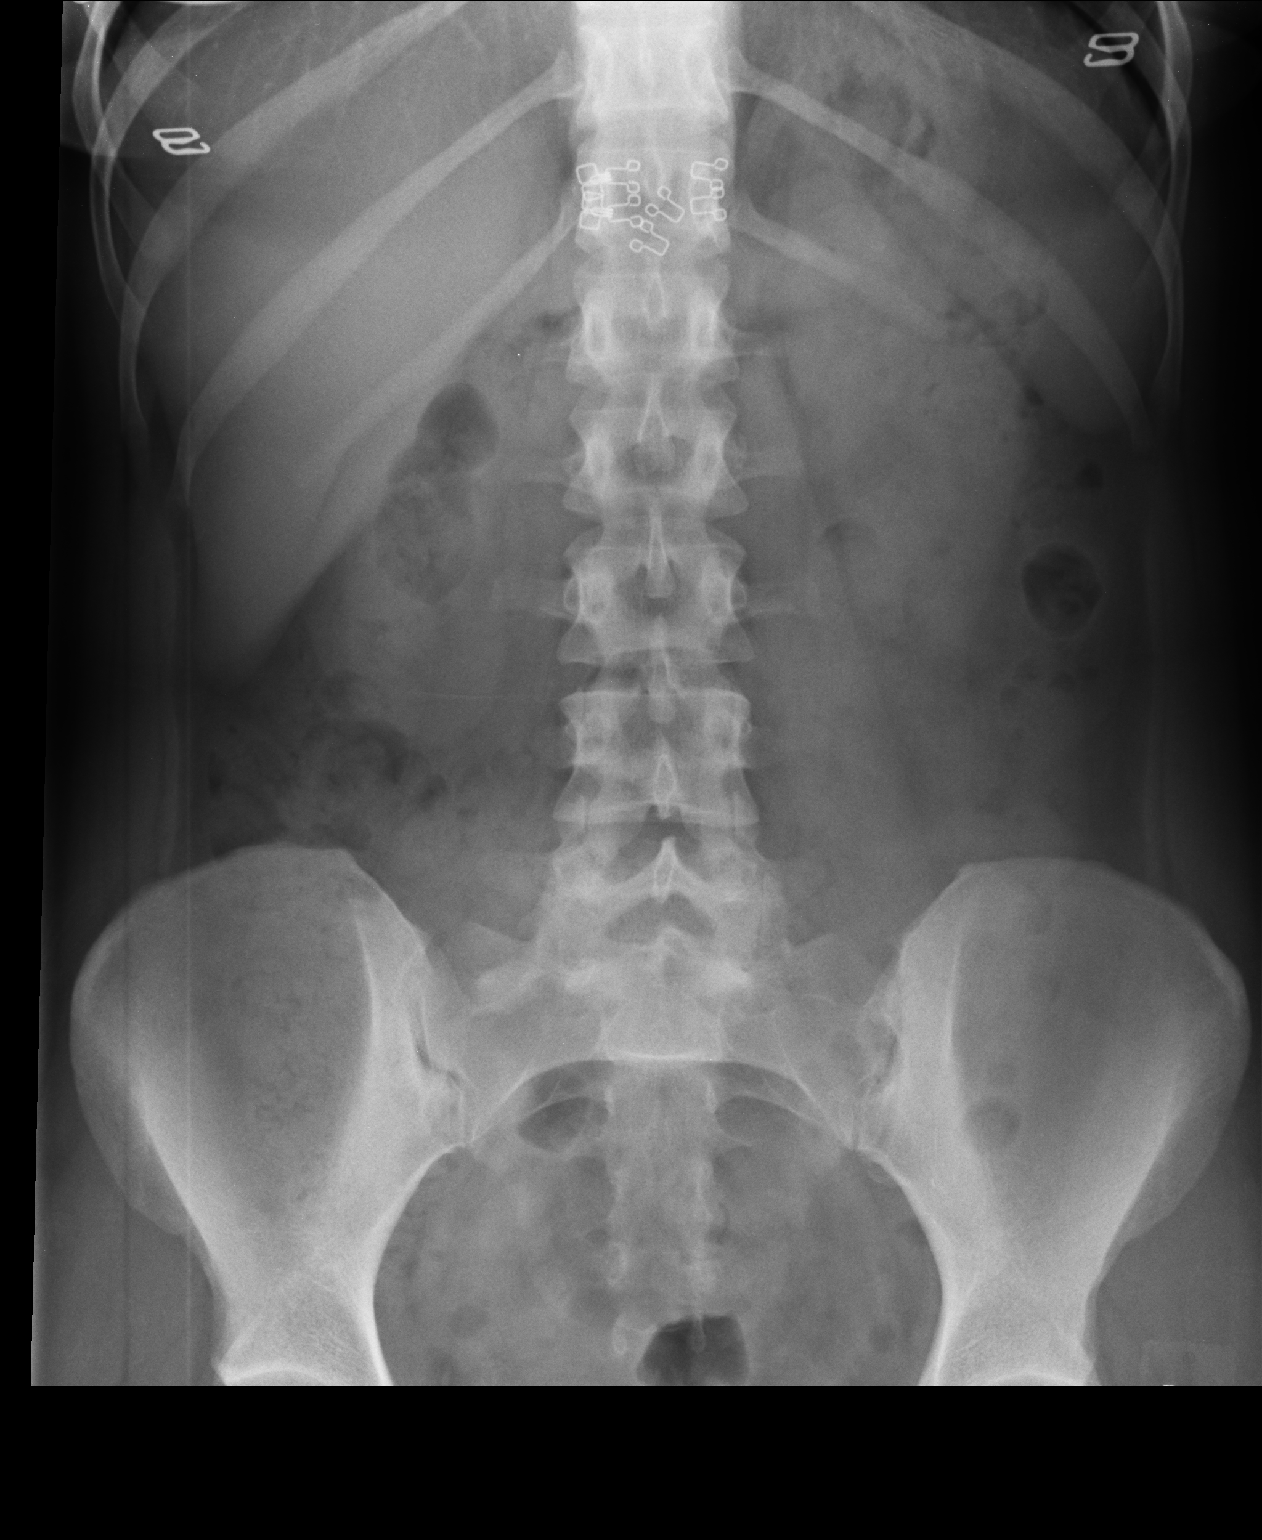

[1 of 1 positions shown; findings below may reference images not displayed]

FINDINGS: Bowel gas pattern is normal. No evidence of abnormal calcification
or soft tissue abnormalities. Visualized bony structures are within
normal limits.
IMPRESSION: Normal abdominal film.

## 2016-10-19 DIAGNOSIS — H5211 Myopia, right eye: Secondary | ICD-10-CM | POA: Diagnosis not present

## 2016-10-19 DIAGNOSIS — H43812 Vitreous degeneration, left eye: Secondary | ICD-10-CM | POA: Diagnosis not present

## 2017-03-28 DIAGNOSIS — Z01419 Encounter for gynecological examination (general) (routine) without abnormal findings: Secondary | ICD-10-CM | POA: Diagnosis not present

## 2017-03-28 DIAGNOSIS — Z30018 Encounter for initial prescription of other contraceptives: Secondary | ICD-10-CM | POA: Diagnosis not present

## 2017-03-28 DIAGNOSIS — Z6829 Body mass index (BMI) 29.0-29.9, adult: Secondary | ICD-10-CM | POA: Diagnosis not present

## 2017-06-23 DIAGNOSIS — A499 Bacterial infection, unspecified: Secondary | ICD-10-CM | POA: Diagnosis not present

## 2017-06-23 DIAGNOSIS — N390 Urinary tract infection, site not specified: Secondary | ICD-10-CM | POA: Diagnosis not present

## 2017-06-23 DIAGNOSIS — R300 Dysuria: Secondary | ICD-10-CM | POA: Diagnosis not present

## 2017-08-14 DIAGNOSIS — R899 Unspecified abnormal finding in specimens from other organs, systems and tissues: Secondary | ICD-10-CM | POA: Diagnosis not present

## 2017-08-14 DIAGNOSIS — Z3009 Encounter for other general counseling and advice on contraception: Secondary | ICD-10-CM | POA: Diagnosis not present

## 2017-08-14 DIAGNOSIS — R5383 Other fatigue: Secondary | ICD-10-CM | POA: Diagnosis not present

## 2017-08-14 DIAGNOSIS — Z0000 Encounter for general adult medical examination without abnormal findings: Secondary | ICD-10-CM | POA: Diagnosis not present

## 2017-10-03 DIAGNOSIS — R4589 Other symptoms and signs involving emotional state: Secondary | ICD-10-CM | POA: Diagnosis not present

## 2017-10-03 DIAGNOSIS — R5383 Other fatigue: Secondary | ICD-10-CM | POA: Diagnosis not present

## 2017-10-19 DIAGNOSIS — N979 Female infertility, unspecified: Secondary | ICD-10-CM | POA: Diagnosis not present

## 2017-10-20 DIAGNOSIS — H5211 Myopia, right eye: Secondary | ICD-10-CM | POA: Diagnosis not present

## 2017-10-20 DIAGNOSIS — H43812 Vitreous degeneration, left eye: Secondary | ICD-10-CM | POA: Diagnosis not present

## 2017-10-31 DIAGNOSIS — I10 Essential (primary) hypertension: Secondary | ICD-10-CM | POA: Diagnosis not present

## 2017-10-31 DIAGNOSIS — Z3009 Encounter for other general counseling and advice on contraception: Secondary | ICD-10-CM | POA: Diagnosis not present

## 2018-01-07 DIAGNOSIS — R509 Fever, unspecified: Secondary | ICD-10-CM | POA: Diagnosis not present

## 2018-01-07 DIAGNOSIS — J029 Acute pharyngitis, unspecified: Secondary | ICD-10-CM | POA: Diagnosis not present

## 2018-01-07 DIAGNOSIS — R05 Cough: Secondary | ICD-10-CM | POA: Diagnosis not present

## 2018-01-07 DIAGNOSIS — J40 Bronchitis, not specified as acute or chronic: Secondary | ICD-10-CM | POA: Diagnosis not present

## 2018-01-16 DIAGNOSIS — R300 Dysuria: Secondary | ICD-10-CM | POA: Diagnosis not present

## 2019-08-01 DIAGNOSIS — F4323 Adjustment disorder with mixed anxiety and depressed mood: Secondary | ICD-10-CM | POA: Diagnosis not present

## 2019-08-19 DIAGNOSIS — F4323 Adjustment disorder with mixed anxiety and depressed mood: Secondary | ICD-10-CM | POA: Diagnosis not present

## 2019-08-21 DIAGNOSIS — Z23 Encounter for immunization: Secondary | ICD-10-CM | POA: Diagnosis not present

## 2019-08-21 DIAGNOSIS — S90562A Insect bite (nonvenomous), left ankle, initial encounter: Secondary | ICD-10-CM | POA: Diagnosis not present

## 2019-08-21 DIAGNOSIS — W57XXXA Bitten or stung by nonvenomous insect and other nonvenomous arthropods, initial encounter: Secondary | ICD-10-CM | POA: Diagnosis not present

## 2019-08-21 DIAGNOSIS — L03116 Cellulitis of left lower limb: Secondary | ICD-10-CM | POA: Diagnosis not present

## 2019-09-04 DIAGNOSIS — F4323 Adjustment disorder with mixed anxiety and depressed mood: Secondary | ICD-10-CM | POA: Diagnosis not present

## 2019-09-07 ENCOUNTER — Other Ambulatory Visit: Payer: Self-pay

## 2019-09-07 DIAGNOSIS — Z20822 Contact with and (suspected) exposure to covid-19: Secondary | ICD-10-CM

## 2019-09-08 LAB — NOVEL CORONAVIRUS, NAA: SARS-CoV-2, NAA: NOT DETECTED

## 2019-09-09 DIAGNOSIS — F4323 Adjustment disorder with mixed anxiety and depressed mood: Secondary | ICD-10-CM | POA: Diagnosis not present

## 2019-09-16 DIAGNOSIS — F4323 Adjustment disorder with mixed anxiety and depressed mood: Secondary | ICD-10-CM | POA: Diagnosis not present

## 2019-09-24 DIAGNOSIS — F4323 Adjustment disorder with mixed anxiety and depressed mood: Secondary | ICD-10-CM | POA: Diagnosis not present

## 2019-10-07 DIAGNOSIS — F4323 Adjustment disorder with mixed anxiety and depressed mood: Secondary | ICD-10-CM | POA: Diagnosis not present

## 2019-10-13 ENCOUNTER — Other Ambulatory Visit: Payer: Self-pay | Admitting: Cardiology

## 2019-10-13 DIAGNOSIS — Z20822 Contact with and (suspected) exposure to covid-19: Secondary | ICD-10-CM

## 2019-10-14 LAB — NOVEL CORONAVIRUS, NAA: SARS-CoV-2, NAA: NOT DETECTED

## 2019-11-11 ENCOUNTER — Other Ambulatory Visit: Payer: Self-pay | Admitting: Cardiology

## 2019-11-11 DIAGNOSIS — Z20822 Contact with and (suspected) exposure to covid-19: Secondary | ICD-10-CM

## 2019-11-11 DIAGNOSIS — Z20828 Contact with and (suspected) exposure to other viral communicable diseases: Secondary | ICD-10-CM | POA: Diagnosis not present

## 2019-11-12 LAB — NOVEL CORONAVIRUS, NAA: SARS-CoV-2, NAA: NOT DETECTED

## 2019-11-13 ENCOUNTER — Other Ambulatory Visit: Payer: 59

## 2019-11-19 ENCOUNTER — Ambulatory Visit: Payer: BC Managed Care – PPO | Attending: Internal Medicine

## 2019-11-19 DIAGNOSIS — Z20828 Contact with and (suspected) exposure to other viral communicable diseases: Secondary | ICD-10-CM | POA: Insufficient documentation

## 2019-11-19 DIAGNOSIS — Z20822 Contact with and (suspected) exposure to covid-19: Secondary | ICD-10-CM

## 2019-11-20 LAB — NOVEL CORONAVIRUS, NAA: SARS-CoV-2, NAA: NOT DETECTED

## 2019-12-31 DIAGNOSIS — F4320 Adjustment disorder, unspecified: Secondary | ICD-10-CM | POA: Diagnosis not present

## 2020-01-14 DIAGNOSIS — F4320 Adjustment disorder, unspecified: Secondary | ICD-10-CM | POA: Diagnosis not present

## 2020-01-23 ENCOUNTER — Ambulatory Visit: Payer: Self-pay | Attending: Family

## 2020-01-23 ENCOUNTER — Other Ambulatory Visit: Payer: Self-pay

## 2020-01-23 DIAGNOSIS — Z23 Encounter for immunization: Secondary | ICD-10-CM | POA: Insufficient documentation

## 2020-01-23 NOTE — Progress Notes (Signed)
   Covid-19 Vaccination Clinic  Name:  Elaine Atkins    MRN: [id number] DOB: Dec 01, 1980  01/23/2020  [name] was observed post Covid-19 immunization for 15 minutes without incident. She was provided with Vaccine Information Sheet and instruction to access the V-Safe system.   [name] was instructed to call 911 with any severe reactions post vaccine: Marland Kitchen Difficulty breathing  . Swelling of face and throat  . A fast heartbeat  . A bad rash all over body  . Dizziness and weakness   Immunizations Administered    Name Date Dose VIS Date Route   Moderna COVID-19 Vaccine 01/23/2020  1:49 PM 0.5 mL 10/22/2019 Intramuscular   Manufacturer: Moderna   Lot: [id number]   NDC: 18984-210-31

## 2020-01-28 DIAGNOSIS — Z1331 Encounter for screening for depression: Secondary | ICD-10-CM | POA: Diagnosis not present

## 2020-01-28 DIAGNOSIS — R5383 Other fatigue: Secondary | ICD-10-CM | POA: Diagnosis not present

## 2020-01-28 DIAGNOSIS — Z131 Encounter for screening for diabetes mellitus: Secondary | ICD-10-CM | POA: Diagnosis not present

## 2020-01-28 DIAGNOSIS — Z0000 Encounter for general adult medical examination without abnormal findings: Secondary | ICD-10-CM | POA: Diagnosis not present

## 2020-01-28 DIAGNOSIS — N915 Oligomenorrhea, unspecified: Secondary | ICD-10-CM | POA: Diagnosis not present

## 2020-01-28 DIAGNOSIS — E559 Vitamin D deficiency, unspecified: Secondary | ICD-10-CM | POA: Diagnosis not present

## 2020-01-28 DIAGNOSIS — R0602 Shortness of breath: Secondary | ICD-10-CM | POA: Diagnosis not present

## 2020-01-28 DIAGNOSIS — Z13220 Encounter for screening for lipoid disorders: Secondary | ICD-10-CM | POA: Diagnosis not present

## 2020-01-29 DIAGNOSIS — F4323 Adjustment disorder with mixed anxiety and depressed mood: Secondary | ICD-10-CM | POA: Diagnosis not present

## 2020-02-03 DIAGNOSIS — Z03818 Encounter for observation for suspected exposure to other biological agents ruled out: Secondary | ICD-10-CM | POA: Diagnosis not present

## 2020-02-03 DIAGNOSIS — Z20828 Contact with and (suspected) exposure to other viral communicable diseases: Secondary | ICD-10-CM | POA: Diagnosis not present

## 2020-02-04 DIAGNOSIS — Z01419 Encounter for gynecological examination (general) (routine) without abnormal findings: Secondary | ICD-10-CM | POA: Diagnosis not present

## 2020-02-04 DIAGNOSIS — Z6829 Body mass index (BMI) 29.0-29.9, adult: Secondary | ICD-10-CM | POA: Diagnosis not present

## 2020-02-05 DIAGNOSIS — F4323 Adjustment disorder with mixed anxiety and depressed mood: Secondary | ICD-10-CM | POA: Diagnosis not present

## 2020-02-19 DIAGNOSIS — F4323 Adjustment disorder with mixed anxiety and depressed mood: Secondary | ICD-10-CM | POA: Diagnosis not present

## 2020-02-25 ENCOUNTER — Ambulatory Visit: Payer: Self-pay | Attending: Family

## 2020-02-25 DIAGNOSIS — Z23 Encounter for immunization: Secondary | ICD-10-CM

## 2020-02-25 NOTE — Progress Notes (Signed)
   Covid-19 Vaccination Clinic  Name:  Elaine Atkins    MRN: [id number] DOB: 1981-04-06  02/25/2020  [name] was observed post Covid-19 immunization for 15 minutes without incident. She was provided with Vaccine Information Sheet and instruction to access the V-Safe system.   [name] was instructed to call 911 with any severe reactions post vaccine: Marland Kitchen Difficulty breathing  . Swelling of face and throat  . A fast heartbeat  . A bad rash all over body  . Dizziness and weakness   Immunizations Administered    Name Date Dose VIS Date Route   Moderna COVID-19 Vaccine 02/25/2020  1:48 PM 0.5 mL 10/22/2019 Intramuscular   Manufacturer: Moderna   Lot: [id number]   NDC: 25247-998-00

## 2020-03-03 DIAGNOSIS — F4323 Adjustment disorder with mixed anxiety and depressed mood: Secondary | ICD-10-CM | POA: Diagnosis not present

## 2020-03-07 DIAGNOSIS — Z03818 Encounter for observation for suspected exposure to other biological agents ruled out: Secondary | ICD-10-CM | POA: Diagnosis not present

## 2020-03-07 DIAGNOSIS — Z20828 Contact with and (suspected) exposure to other viral communicable diseases: Secondary | ICD-10-CM | POA: Diagnosis not present

## 2020-03-11 DIAGNOSIS — N390 Urinary tract infection, site not specified: Secondary | ICD-10-CM | POA: Diagnosis not present

## 2020-03-11 DIAGNOSIS — N898 Other specified noninflammatory disorders of vagina: Secondary | ICD-10-CM | POA: Diagnosis not present

## 2020-03-11 DIAGNOSIS — R1084 Generalized abdominal pain: Secondary | ICD-10-CM | POA: Diagnosis not present

## 2020-03-16 DIAGNOSIS — F4323 Adjustment disorder with mixed anxiety and depressed mood: Secondary | ICD-10-CM | POA: Diagnosis not present

## 2020-03-24 DIAGNOSIS — F4323 Adjustment disorder with mixed anxiety and depressed mood: Secondary | ICD-10-CM | POA: Diagnosis not present

## 2020-04-14 DIAGNOSIS — F4323 Adjustment disorder with mixed anxiety and depressed mood: Secondary | ICD-10-CM | POA: Diagnosis not present

## 2020-04-15 ENCOUNTER — Ambulatory Visit: Payer: BC Managed Care – PPO | Attending: Internal Medicine

## 2020-04-15 DIAGNOSIS — Z20822 Contact with and (suspected) exposure to covid-19: Secondary | ICD-10-CM | POA: Diagnosis not present

## 2020-04-16 LAB — SARS-COV-2, NAA 2 DAY TAT

## 2020-04-16 LAB — NOVEL CORONAVIRUS, NAA: SARS-CoV-2, NAA: NOT DETECTED

## 2020-04-21 DIAGNOSIS — F4323 Adjustment disorder with mixed anxiety and depressed mood: Secondary | ICD-10-CM | POA: Diagnosis not present

## 2020-05-20 DIAGNOSIS — F4323 Adjustment disorder with mixed anxiety and depressed mood: Secondary | ICD-10-CM | POA: Diagnosis not present

## 2020-05-26 DIAGNOSIS — F4323 Adjustment disorder with mixed anxiety and depressed mood: Secondary | ICD-10-CM | POA: Diagnosis not present

## 2020-06-03 DIAGNOSIS — F4323 Adjustment disorder with mixed anxiety and depressed mood: Secondary | ICD-10-CM | POA: Diagnosis not present

## 2020-06-10 DIAGNOSIS — F4323 Adjustment disorder with mixed anxiety and depressed mood: Secondary | ICD-10-CM | POA: Diagnosis not present

## 2020-06-21 DIAGNOSIS — L299 Pruritus, unspecified: Secondary | ICD-10-CM | POA: Diagnosis not present

## 2020-06-21 DIAGNOSIS — N913 Primary oligomenorrhea: Secondary | ICD-10-CM | POA: Diagnosis not present

## 2020-06-21 DIAGNOSIS — R030 Elevated blood-pressure reading, without diagnosis of hypertension: Secondary | ICD-10-CM | POA: Diagnosis not present

## 2020-06-21 DIAGNOSIS — S80869S Insect bite (nonvenomous), unspecified lower leg, sequela: Secondary | ICD-10-CM | POA: Diagnosis not present

## 2020-06-24 DIAGNOSIS — F4323 Adjustment disorder with mixed anxiety and depressed mood: Secondary | ICD-10-CM | POA: Diagnosis not present

## 2020-07-20 DIAGNOSIS — F4323 Adjustment disorder with mixed anxiety and depressed mood: Secondary | ICD-10-CM | POA: Diagnosis not present

## 2020-08-05 DIAGNOSIS — F4323 Adjustment disorder with mixed anxiety and depressed mood: Secondary | ICD-10-CM | POA: Diagnosis not present

## 2020-08-19 DIAGNOSIS — F4323 Adjustment disorder with mixed anxiety and depressed mood: Secondary | ICD-10-CM | POA: Diagnosis not present

## 2020-09-02 DIAGNOSIS — F4323 Adjustment disorder with mixed anxiety and depressed mood: Secondary | ICD-10-CM | POA: Diagnosis not present

## 2020-09-09 DIAGNOSIS — Z20822 Contact with and (suspected) exposure to covid-19: Secondary | ICD-10-CM | POA: Diagnosis not present

## 2020-09-12 DIAGNOSIS — Z20822 Contact with and (suspected) exposure to covid-19: Secondary | ICD-10-CM | POA: Diagnosis not present

## 2020-09-12 DIAGNOSIS — R0981 Nasal congestion: Secondary | ICD-10-CM | POA: Diagnosis not present

## 2020-09-12 DIAGNOSIS — R059 Cough, unspecified: Secondary | ICD-10-CM | POA: Diagnosis not present

## 2020-10-12 DIAGNOSIS — Z20822 Contact with and (suspected) exposure to covid-19: Secondary | ICD-10-CM | POA: Diagnosis not present

## 2020-11-05 DIAGNOSIS — F4323 Adjustment disorder with mixed anxiety and depressed mood: Secondary | ICD-10-CM | POA: Diagnosis not present

## 2020-11-09 DIAGNOSIS — Z20822 Contact with and (suspected) exposure to covid-19: Secondary | ICD-10-CM | POA: Diagnosis not present

## 2020-11-10 DIAGNOSIS — F4323 Adjustment disorder with mixed anxiety and depressed mood: Secondary | ICD-10-CM | POA: Diagnosis not present

## 2020-11-25 DIAGNOSIS — F4323 Adjustment disorder with mixed anxiety and depressed mood: Secondary | ICD-10-CM | POA: Diagnosis not present

## 2020-12-23 DIAGNOSIS — F4323 Adjustment disorder with mixed anxiety and depressed mood: Secondary | ICD-10-CM | POA: Diagnosis not present

## 2020-12-29 DIAGNOSIS — F4323 Adjustment disorder with mixed anxiety and depressed mood: Secondary | ICD-10-CM | POA: Diagnosis not present

## 2021-01-07 DIAGNOSIS — F4323 Adjustment disorder with mixed anxiety and depressed mood: Secondary | ICD-10-CM | POA: Diagnosis not present

## 2021-02-02 DIAGNOSIS — F4323 Adjustment disorder with mixed anxiety and depressed mood: Secondary | ICD-10-CM | POA: Diagnosis not present

## 2021-02-15 DIAGNOSIS — J309 Allergic rhinitis, unspecified: Secondary | ICD-10-CM | POA: Diagnosis not present

## 2021-02-16 DIAGNOSIS — F4323 Adjustment disorder with mixed anxiety and depressed mood: Secondary | ICD-10-CM | POA: Diagnosis not present

## 2021-05-18 DIAGNOSIS — F4323 Adjustment disorder with mixed anxiety and depressed mood: Secondary | ICD-10-CM | POA: Diagnosis not present

## 2021-06-03 DIAGNOSIS — F4323 Adjustment disorder with mixed anxiety and depressed mood: Secondary | ICD-10-CM | POA: Diagnosis not present

## 2021-06-08 DIAGNOSIS — Z01419 Encounter for gynecological examination (general) (routine) without abnormal findings: Secondary | ICD-10-CM | POA: Diagnosis not present

## 2021-06-08 DIAGNOSIS — Z6828 Body mass index (BMI) 28.0-28.9, adult: Secondary | ICD-10-CM | POA: Diagnosis not present

## 2021-06-14 DIAGNOSIS — Z20822 Contact with and (suspected) exposure to covid-19: Secondary | ICD-10-CM | POA: Diagnosis not present

## 2021-06-20 DIAGNOSIS — Z20822 Contact with and (suspected) exposure to covid-19: Secondary | ICD-10-CM | POA: Diagnosis not present

## 2021-07-07 DIAGNOSIS — M7500 Adhesive capsulitis of unspecified shoulder: Secondary | ICD-10-CM | POA: Diagnosis not present

## 2021-07-07 DIAGNOSIS — S161XXA Strain of muscle, fascia and tendon at neck level, initial encounter: Secondary | ICD-10-CM | POA: Diagnosis not present

## 2021-07-20 DIAGNOSIS — N921 Excessive and frequent menstruation with irregular cycle: Secondary | ICD-10-CM | POA: Diagnosis not present

## 2021-07-20 DIAGNOSIS — N840 Polyp of corpus uteri: Secondary | ICD-10-CM | POA: Diagnosis not present

## 2021-07-29 DIAGNOSIS — D259 Leiomyoma of uterus, unspecified: Secondary | ICD-10-CM | POA: Diagnosis not present

## 2021-07-29 DIAGNOSIS — N921 Excessive and frequent menstruation with irregular cycle: Secondary | ICD-10-CM | POA: Diagnosis not present

## 2021-08-11 DIAGNOSIS — M7500 Adhesive capsulitis of unspecified shoulder: Secondary | ICD-10-CM | POA: Diagnosis not present

## 2021-08-11 DIAGNOSIS — S161XXA Strain of muscle, fascia and tendon at neck level, initial encounter: Secondary | ICD-10-CM | POA: Diagnosis not present

## 2021-08-11 DIAGNOSIS — G5622 Lesion of ulnar nerve, left upper limb: Secondary | ICD-10-CM | POA: Diagnosis not present

## 2021-08-16 DIAGNOSIS — Z0000 Encounter for general adult medical examination without abnormal findings: Secondary | ICD-10-CM | POA: Diagnosis not present

## 2021-08-18 DIAGNOSIS — R030 Elevated blood-pressure reading, without diagnosis of hypertension: Secondary | ICD-10-CM | POA: Diagnosis not present

## 2021-08-18 DIAGNOSIS — Z0000 Encounter for general adult medical examination without abnormal findings: Secondary | ICD-10-CM | POA: Diagnosis not present

## 2021-08-27 ENCOUNTER — Other Ambulatory Visit: Payer: Self-pay | Admitting: Family Medicine

## 2021-08-27 DIAGNOSIS — Z1231 Encounter for screening mammogram for malignant neoplasm of breast: Secondary | ICD-10-CM

## 2021-09-18 ENCOUNTER — Telehealth: Payer: BC Managed Care – PPO | Admitting: Emergency Medicine

## 2021-09-18 DIAGNOSIS — R300 Dysuria: Secondary | ICD-10-CM

## 2021-09-18 MED ORDER — CIPROFLOXACIN HCL 500 MG PO TABS: 500.0000 mg | ORAL_TABLET | Freq: Two times a day (BID) | ORAL | 0 refills | Status: AC

## 2021-09-18 NOTE — Progress Notes (Signed)
E-Visit for Urinary Problems  We are sorry that you are not feeling well.  Here is how we plan to help!  Based on what you shared with me it looks like you most likely have a simple urinary tract infection.  A UTI (Urinary Tract Infection) is a bacterial infection of the bladder.  Most cases of urinary tract infections are simple to treat but a key part of your care is to encourage you to drink plenty of fluids and watch your symptoms carefully.  I have prescribed ciprofloxacin 500 mg twice daily for 7 days.  Your symptoms should gradually improve. Call us if the burning in your urine worsens, you develop worsening fever, back pain or pelvic pain or if your symptoms do not resolve after completing the antibiotic.  Urinary tract infections can be prevented by drinking plenty of water to keep your body hydrated.  Also be sure when you wipe, wipe from front to back and don't hold it in!  If possible, empty your bladder every 4 hours.   Please seek in person evaluation if symptoms do not improve with above medications.  You may need to have urine tested, vaginal swab and/ or pelvic exam.     HOME CARE Drink plenty of fluids Compete the full course of the antibiotics even if the symptoms resolve Remember, when you need to go.go. Holding in your urine can increase the likelihood of getting a UTI! GET HELP RIGHT AWAY IF: You cannot urinate You get a high fever Worsening back pain occurs You see blood in your urine You feel sick to your stomach or throw up You feel like you are going to pass out  MAKE SURE YOU  Understand these instructions. Will watch your condition. Will get help right away if you are not doing well or get worse.   Thank you for choosing an e-visit.  Your e-visit answers were reviewed by a board certified advanced clinical practitioner to complete your personal care plan. Depending upon the condition, your plan could have included both over the counter or prescription  medications.  Please review your pharmacy choice. Make sure the pharmacy is open so you can pick up prescription now. If there is a problem, you may contact your provider through [employer] and have the prescription routed to another pharmacy.  Your safety is important to [place]. If you have drug allergies check your prescription carefully.   For the next 24 hours you can use MyChart to ask questions about today's visit, request a non-urgent call back, or ask for a work or school excuse. You will get an email in the next two days asking about your experience. I hope that your e-visit has been valuable and will speed your recovery.

## 2021-09-18 NOTE — Progress Notes (Signed)
I have spent 5 minutes in review of e-visit questionnaire, review and updating patient chart, medical decision making and response to patient.   Timiyah Romito, PA-C    

## 2021-10-11 DIAGNOSIS — M542 Cervicalgia: Secondary | ICD-10-CM | POA: Diagnosis not present

## 2021-10-11 DIAGNOSIS — E663 Overweight: Secondary | ICD-10-CM | POA: Diagnosis not present

## 2021-10-11 DIAGNOSIS — F411 Generalized anxiety disorder: Secondary | ICD-10-CM | POA: Diagnosis not present

## 2021-10-11 DIAGNOSIS — I10 Essential (primary) hypertension: Secondary | ICD-10-CM | POA: Diagnosis not present

## 2021-10-26 DIAGNOSIS — F4323 Adjustment disorder with mixed anxiety and depressed mood: Secondary | ICD-10-CM | POA: Diagnosis not present

## 2021-11-09 DIAGNOSIS — F4323 Adjustment disorder with mixed anxiety and depressed mood: Secondary | ICD-10-CM | POA: Diagnosis not present

## 2021-11-23 DIAGNOSIS — E663 Overweight: Secondary | ICD-10-CM | POA: Diagnosis not present

## 2021-11-23 DIAGNOSIS — F411 Generalized anxiety disorder: Secondary | ICD-10-CM | POA: Diagnosis not present

## 2021-11-23 DIAGNOSIS — I10 Essential (primary) hypertension: Secondary | ICD-10-CM | POA: Diagnosis not present

## 2021-12-20 DIAGNOSIS — E782 Mixed hyperlipidemia: Secondary | ICD-10-CM | POA: Diagnosis not present

## 2021-12-30 DIAGNOSIS — E663 Overweight: Secondary | ICD-10-CM | POA: Diagnosis not present

## 2021-12-30 DIAGNOSIS — I10 Essential (primary) hypertension: Secondary | ICD-10-CM | POA: Diagnosis not present

## 2021-12-30 DIAGNOSIS — F411 Generalized anxiety disorder: Secondary | ICD-10-CM | POA: Diagnosis not present

## 2021-12-30 DIAGNOSIS — E782 Mixed hyperlipidemia: Secondary | ICD-10-CM | POA: Diagnosis not present

## 2022-01-25 DIAGNOSIS — F4323 Adjustment disorder with mixed anxiety and depressed mood: Secondary | ICD-10-CM | POA: Diagnosis not present

## 2022-02-03 ENCOUNTER — Telehealth: Payer: BC Managed Care – PPO | Admitting: Physician Assistant

## 2022-02-03 DIAGNOSIS — B9689 Other specified bacterial agents as the cause of diseases classified elsewhere: Secondary | ICD-10-CM | POA: Diagnosis not present

## 2022-02-03 DIAGNOSIS — J019 Acute sinusitis, unspecified: Secondary | ICD-10-CM | POA: Diagnosis not present

## 2022-02-03 DIAGNOSIS — F4323 Adjustment disorder with mixed anxiety and depressed mood: Secondary | ICD-10-CM | POA: Diagnosis not present

## 2022-02-03 MED ORDER — DOXYCYCLINE HYCLATE 100 MG PO TABS: 100.0000 mg | ORAL_TABLET | Freq: Two times a day (BID) | ORAL | 0 refills | Status: DC

## 2022-02-03 NOTE — Progress Notes (Signed)
I have spent 5 minutes in review of e-visit questionnaire, review and updating patient chart, medical decision making and response to patient.   Renezmae Canlas Cody Joquan Lotz, PA-C    

## 2022-02-03 NOTE — Progress Notes (Signed)

## 2022-02-09 DIAGNOSIS — F4323 Adjustment disorder with mixed anxiety and depressed mood: Secondary | ICD-10-CM | POA: Diagnosis not present

## 2022-04-04 DIAGNOSIS — E039 Hypothyroidism, unspecified: Secondary | ICD-10-CM | POA: Diagnosis not present

## 2022-04-04 DIAGNOSIS — E782 Mixed hyperlipidemia: Secondary | ICD-10-CM | POA: Diagnosis not present

## 2022-04-04 DIAGNOSIS — I10 Essential (primary) hypertension: Secondary | ICD-10-CM | POA: Diagnosis not present

## 2022-04-07 DIAGNOSIS — E782 Mixed hyperlipidemia: Secondary | ICD-10-CM | POA: Diagnosis not present

## 2022-04-07 DIAGNOSIS — I10 Essential (primary) hypertension: Secondary | ICD-10-CM | POA: Diagnosis not present

## 2022-04-07 DIAGNOSIS — J309 Allergic rhinitis, unspecified: Secondary | ICD-10-CM | POA: Diagnosis not present

## 2022-05-18 DIAGNOSIS — R41840 Attention and concentration deficit: Secondary | ICD-10-CM | POA: Diagnosis not present

## 2022-05-18 DIAGNOSIS — F900 Attention-deficit hyperactivity disorder, predominantly inattentive type: Secondary | ICD-10-CM | POA: Diagnosis not present

## 2022-05-18 DIAGNOSIS — Z79899 Other long term (current) drug therapy: Secondary | ICD-10-CM | POA: Diagnosis not present

## 2022-06-20 DIAGNOSIS — F902 Attention-deficit hyperactivity disorder, combined type: Secondary | ICD-10-CM | POA: Diagnosis not present

## 2022-06-20 DIAGNOSIS — F900 Attention-deficit hyperactivity disorder, predominantly inattentive type: Secondary | ICD-10-CM | POA: Diagnosis not present

## 2022-06-20 DIAGNOSIS — Z79899 Other long term (current) drug therapy: Secondary | ICD-10-CM | POA: Diagnosis not present

## 2022-07-09 ENCOUNTER — Ambulatory Visit (HOSPITAL_COMMUNITY)
Admission: EM | Admit: 2022-07-09 | Discharge: 2022-07-09 | Disposition: A | Discharge status: Home / Self Care | Payer: BC Managed Care – PPO

## 2022-07-09 ENCOUNTER — Encounter (HOSPITAL_COMMUNITY): Payer: Self-pay

## 2022-07-09 DIAGNOSIS — S86912A Strain of unspecified muscle(s) and tendon(s) at lower leg level, left leg, initial encounter: Secondary | ICD-10-CM | POA: Diagnosis not present

## 2022-07-09 HISTORY — DX: Attention-deficit hyperactivity disorder, unspecified type: F90.9

## 2022-07-09 MED ORDER — BACLOFEN 10 MG PO TABS: 10.0000 mg | ORAL_TABLET | Freq: Three times a day (TID) | ORAL | 0 refills | Status: AC

## 2022-07-09 MED ORDER — IBUPROFEN 600 MG PO TABS: 600.0000 mg | ORAL_TABLET | Freq: Three times a day (TID) | ORAL | 0 refills | Status: DC | PRN

## 2022-07-09 NOTE — ED Provider Notes (Signed)
[facility]    CSN: [id number] Arrival date & time: 07/09/22  1506    HISTORY   Chief Complaint  Patient presents with   Leg Injury   HPI Elaine Atkins is a pleasant, 41 y.o. female who presents to urgent care today. Patient having pain in the left calf onset this morning while playing kick ball. Patient states she kicked the ball with her left foot and started to run for base.  The left leg locked up and onset of sharp pain in the left calf.  No known previous injury   The history is provided by the patient.   Past Medical History:  Diagnosis Date   ADHD    Allergy    Patient Active Problem List   Diagnosis Date Noted   CN (constipation) 05/16/2015   Past Surgical History:  Procedure Laterality Date   TONSILLECTOMY     OB History   No obstetric history on file.    Home Medications    Prior to Admission medications   Medication Sig Start Date End Date Taking? Authorizing Provider  ADZENYS XR-ODT 9.4 MG TBED Take 1 tablet by mouth daily. 06/15/22  Yes [provider]  valsartan-hydrochlorothiazide (DIOVAN-HCT) 80-12.5 MG tablet Take 1 tablet by mouth daily. 04/09/22  Yes [provider]    Family History Family History  Problem Relation Age of Onset   Colitis Mother    Hypothyroidism Father    Social History Social History   Tobacco Use   Smoking status: Never   Smokeless tobacco: Never  Substance Use Topics   Alcohol use: No    Alcohol/week: 0.0 standard drinks of alcohol   Allergies   Nexium [esomeprazole magnesium] and Suprax [cefixime]  Review of Systems Review of Systems Pertinent findings revealed after performing a 14 point review of systems has been noted in the history of present illness.  Physical Exam Triage Vital Signs ED Triage Vitals  Enc Vitals Group     BP 09/17/21 0827 (!) 147/82     Pulse Rate 09/17/21 0827 72     Resp 09/17/21 0827 18     Temp 09/17/21 0827 98.3 F (36.8 C)     Temp Source  09/17/21 0827 Oral     SpO2 09/17/21 0827 98 %     Weight --      Height --      Head Circumference --      Peak Flow --      Pain Score 09/17/21 0826 5     Pain Loc --      Pain Edu? --      Excl. in GC? --   No data found.  Updated Vital Signs BP 124/88 (BP Location: Right Arm)   Pulse 92   Temp 98.2 F (36.8 C) (Oral)   Resp 16   Ht 5\' 4"  (1.626 m)   Wt 153 lb (69.4 kg)   LMP 07/01/2022 (Approximate)   SpO2 99%   BMI 26.26 kg/m   Physical Exam Vitals and nursing note reviewed.  Constitutional:      General: She is not in acute distress.    Appearance: Normal appearance.  HENT:     Head: Normocephalic and atraumatic.  Eyes:     Pupils: Pupils are equal, round, and reactive to light.  Cardiovascular:     Rate and Rhythm: Normal rate and regular rhythm.  Pulmonary:     Effort: Pulmonary effort is normal.     Breath sounds: Normal  breath sounds.  Musculoskeletal:        General: Normal range of motion.     Cervical back: Normal range of motion and neck supple.     Right knee: Normal.     Left knee: Normal.     Right lower leg: Normal.     Left lower leg: Tenderness (Calf muscle) present. No swelling, deformity, lacerations or bony tenderness. No edema.     Right ankle: Normal.     Left ankle: Normal.  Skin:    General: Skin is warm and dry.  Neurological:     General: No focal deficit present.     Mental Status: She is alert and oriented to person, place, and time. Mental status is at baseline.  Psychiatric:        Mood and Affect: Mood normal.        Behavior: Behavior normal.        Thought Content: Thought content normal.        Judgment: Judgment normal.     Visual Acuity Right Eye Distance:   Left Eye Distance:   Bilateral Distance:    Right Eye Near:   Left Eye Near:    Bilateral Near:     UC Couse / Diagnostics / Procedures:     Radiology No results found.  Procedures Procedures (including critical care time) EKG  Pending results:   Labs Reviewed - No data to display  Medications Ordered in UC: Medications - No data to display  UC Diagnoses / Final Clinical Impressions(s)   I have reviewed the triage vital signs and the nursing notes.  Pertinent labs & imaging results that were available during my care of the patient were reviewed by me and considered in my medical decision making (see chart for details).    Final diagnoses:  Muscle strain of left lower leg, initial encounter   Patient advised that I do not have the correct diagnostics to evaluate for serious conditions such as DVT or tendon rupture.  Given patient's presentation today, normal vital signs, lack of risk factors for DVT, I believe the most likely cause of her pain at this time is a muscle strain.  Patient provided with contact information for [facility] urgent care walk-in clinic should she not seen improvement of her pain in the next few days.  Recommended ibuprofen and baclofen for pain.  ED Prescriptions     Medication Sig Dispense Auth. Provider   baclofen (LIORESAL) 10 MG tablet Take 1 tablet (10 mg total) by mouth 3 (three) times daily for 7 days. 21 tablet Theadora Rama Scales, PA-C   ibuprofen (ADVIL) 600 MG tablet Take 1 tablet (600 mg total) by mouth every 8 (eight) hours as needed for up to 30 doses for fever, headache, mild pain or moderate pain (Inflammation). Take 1 tablet 3 times daily as needed for inflammation of upper airways and/or pain. 30 tablet Theadora Rama Scales, PA-C      PDMP not reviewed this encounter.  Pending results:  Labs Reviewed - No data to display  Discharge Instructions:   Discharge Instructions      You are provided with crutches to help get around today.  Recommend that you remain off your left leg is much as you can to allow the muscles to relax and heal.  Attempting to stretch the muscle may actually make your symptoms worse so please ignore the urge to do so.    In the next few days, if  you see  redness or begin to have swelling in your left lower leg, this is concerning for either cellulitis or a deep vein thrombosis (blood clot) and requires emergency evaluation, I recommend you go to the ED if either of these occur.  In the meantime, I recommend that you use ibuprofen 600 to 800 mg every 8 hours for pain as needed and take a muscle relaxer called baclofen as needed.  I have provided you with a prescription for both.  If you have not had improvement of your symptoms by Monday, recommended to consider going to orthopedic urgent care clinic such as the one at [facility] which I have listed in your checkout sheet today.  Please keep in mind that they do not schedule appointments at their urgent care clinic, they see patients first, first serve.  Thank you for visiting urgent care today.    Disposition Upon Discharge:  Condition: stable for discharge home  Patient presented with an acute illness with associated systemic symptoms and significant discomfort requiring urgent management. In my opinion, this is a condition that a prudent lay person (someone who possesses an average knowledge of health and medicine) may potentially expect to result in complications if not addressed urgently such as respiratory distress, impairment of bodily function or dysfunction of bodily organs.   Routine symptom specific, illness specific and/or disease specific instructions were discussed with the patient and/or caregiver at length.   As such, the patient has been evaluated and assessed, work-up was performed and treatment was provided in alignment with urgent care protocols and evidence based medicine.  Patient/parent/caregiver has been advised that the patient may require follow up for further testing and treatment if the symptoms continue in spite of treatment, as clinically indicated and appropriate.  Patient/parent/caregiver has been advised to return to the [facility] or PCP if no better; to  PCP or the Emergency Department if new signs and symptoms develop, or if the current signs or symptoms continue to change or worsen for further workup, evaluation and treatment as clinically indicated and appropriate  The patient will follow up with their current PCP if and as advised. If the patient does not currently have a PCP we will assist them in obtaining one.   The patient may need specialty follow up if the symptoms continue, in spite of conservative treatment and management, for further workup, evaluation, consultation and treatment as clinically indicated and appropriate.   Patient/parent/caregiver verbalized understanding and agreement of plan as discussed.  All questions were addressed during visit.  Please see discharge instructions below for further details of plan.  This office note has been dictated using Teaching laboratory technician.  Unfortunately, this method of dictation can sometimes lead to typographical or grammatical errors.  I apologize for your inconvenience in advance if this occurs.  Please do not hesitate to reach out to me if clarification is needed.      Theadora Rama Scales, PA-C 07/09/22 1706

## 2022-07-09 NOTE — ED Triage Notes (Signed)
Patient having pain in the left calf onset this morning while playing kick ball. Patient states she kicked the ball with her left foot and started to run for base.  The left leg locked up and onset of sharp pain in the left calf.  No known previous injury

## 2022-07-09 NOTE — Discharge Instructions (Addendum)
You are provided with crutches to help get around today.  Recommend that you remain off your left leg is much as you can to allow the muscles to relax and heal.  Attempting to stretch the muscle may actually make your symptoms worse so please ignore the urge to do so.    In the next few days, if you see redness or begin to have swelling in your left lower leg, this is concerning for either cellulitis or a deep vein thrombosis (blood clot) and requires emergency evaluation, I recommend you go to the ED if either of these occur.  In the meantime, I recommend that you use ibuprofen 600 to 800 mg every 8 hours for pain as needed and take a muscle relaxer called baclofen as needed.  I have provided you with a prescription for both.  If you have not had improvement of your symptoms by Monday, recommended to consider going to orthopedic urgent care clinic such as the one at [facility] which I have listed in your checkout sheet today.  Please keep in mind that they do not schedule appointments at their urgent care clinic, they see patients first, first serve.  Thank you for visiting urgent care today.

## 2022-07-11 DIAGNOSIS — M79662 Pain in left lower leg: Secondary | ICD-10-CM | POA: Diagnosis not present

## 2022-07-19 DIAGNOSIS — S86112A Strain of other muscle(s) and tendon(s) of posterior muscle group at lower leg level, left leg, initial encounter: Secondary | ICD-10-CM | POA: Diagnosis not present

## 2022-07-19 DIAGNOSIS — M25572 Pain in left ankle and joints of left foot: Secondary | ICD-10-CM | POA: Diagnosis not present

## 2022-07-19 DIAGNOSIS — M79662 Pain in left lower leg: Secondary | ICD-10-CM | POA: Diagnosis not present

## 2022-07-28 DIAGNOSIS — M79662 Pain in left lower leg: Secondary | ICD-10-CM | POA: Diagnosis not present

## 2022-07-28 DIAGNOSIS — M25572 Pain in left ankle and joints of left foot: Secondary | ICD-10-CM | POA: Diagnosis not present

## 2022-08-15 DIAGNOSIS — F4323 Adjustment disorder with mixed anxiety and depressed mood: Secondary | ICD-10-CM | POA: Diagnosis not present

## 2022-08-15 DIAGNOSIS — Z0000 Encounter for general adult medical examination without abnormal findings: Secondary | ICD-10-CM | POA: Diagnosis not present

## 2022-08-15 DIAGNOSIS — Z114 Encounter for screening for human immunodeficiency virus [HIV]: Secondary | ICD-10-CM | POA: Diagnosis not present

## 2022-08-18 DIAGNOSIS — S86112D Strain of other muscle(s) and tendon(s) of posterior muscle group at lower leg level, left leg, subsequent encounter: Secondary | ICD-10-CM | POA: Diagnosis not present

## 2022-08-25 DIAGNOSIS — Z6828 Body mass index (BMI) 28.0-28.9, adult: Secondary | ICD-10-CM | POA: Diagnosis not present

## 2022-08-25 DIAGNOSIS — Z01419 Encounter for gynecological examination (general) (routine) without abnormal findings: Secondary | ICD-10-CM | POA: Diagnosis not present

## 2022-08-25 DIAGNOSIS — Z1231 Encounter for screening mammogram for malignant neoplasm of breast: Secondary | ICD-10-CM | POA: Diagnosis not present

## 2022-08-30 DIAGNOSIS — Z6829 Body mass index (BMI) 29.0-29.9, adult: Secondary | ICD-10-CM | POA: Diagnosis not present

## 2022-08-30 DIAGNOSIS — R059 Cough, unspecified: Secondary | ICD-10-CM | POA: Diagnosis not present

## 2022-08-30 DIAGNOSIS — R7309 Other abnormal glucose: Secondary | ICD-10-CM | POA: Diagnosis not present

## 2022-09-05 ENCOUNTER — Telehealth: Payer: BC Managed Care – PPO | Admitting: Family Medicine

## 2022-09-05 DIAGNOSIS — R0989 Other specified symptoms and signs involving the circulatory and respiratory systems: Secondary | ICD-10-CM

## 2022-09-05 DIAGNOSIS — B9689 Other specified bacterial agents as the cause of diseases classified elsewhere: Secondary | ICD-10-CM

## 2022-09-05 DIAGNOSIS — J019 Acute sinusitis, unspecified: Secondary | ICD-10-CM

## 2022-09-05 MED ORDER — DOXYCYCLINE HYCLATE 100 MG PO TABS: 100.0000 mg | ORAL_TABLET | Freq: Two times a day (BID) | ORAL | 0 refills | Status: AC

## 2022-09-05 NOTE — Progress Notes (Signed)

## 2022-09-21 DIAGNOSIS — Z79899 Other long term (current) drug therapy: Secondary | ICD-10-CM | POA: Diagnosis not present

## 2022-09-21 DIAGNOSIS — F900 Attention-deficit hyperactivity disorder, predominantly inattentive type: Secondary | ICD-10-CM | POA: Diagnosis not present

## 2022-09-23 DIAGNOSIS — S86112D Strain of other muscle(s) and tendon(s) of posterior muscle group at lower leg level, left leg, subsequent encounter: Secondary | ICD-10-CM | POA: Diagnosis not present

## 2023-09-29 ENCOUNTER — Telehealth: Payer: BC Managed Care – PPO | Admitting: Physician Assistant

## 2023-09-29 DIAGNOSIS — J22 Unspecified acute lower respiratory infection: Secondary | ICD-10-CM

## 2023-09-30 MED ORDER — AZITHROMYCIN 250 MG PO TABS: ORAL_TABLET | ORAL | 0 refills | Status: DC

## 2023-09-30 MED ORDER — ALBUTEROL SULFATE HFA 108 (90 BASE) MCG/ACT IN AERS: 2.0000 | INHALATION_SPRAY | Freq: Four times a day (QID) | RESPIRATORY_TRACT | 1 refills | Status: DC | PRN

## 2023-09-30 MED ORDER — BENZONATATE 200 MG PO CAPS: 200.0000 mg | ORAL_CAPSULE | Freq: Three times a day (TID) | ORAL | 0 refills | Status: DC | PRN

## 2023-09-30 NOTE — Progress Notes (Signed)
E-Visit for Cough   We are sorry that you are not feeling well.  Here is how we plan to help!  Based on your presentation I believe you most likely have A cough due to bacteria.  When patients have a fever and a productive cough with a change in color or increased sputum production, we are concerned about bacterial bronchitis.  If left untreated it can progress to pneumonia.  If your symptoms do not improve with your treatment plan it is important that you contact your provider.   I have prescribed Azithromyin 250 mg: two tablets now and then one tablet daily for 4 additonal days    In addition you may use A prescription cough medication called Tessalon Perles 200mg . You may take 1 capsules every 8 hours as needed for your cough.  I've also prescribed an inhaler for cough and wheezing. Use as directed.  From your responses in the eVisit questionnaire you describe inflammation in the upper respiratory tract which is causing a significant cough.  This is commonly called Bronchitis and has four common causes:   Allergies Viral Infections Acid Reflux Bacterial Infection Allergies, viruses and acid reflux are treated by controlling symptoms or eliminating the cause. An example might be a cough caused by taking certain blood pressure medications. You stop the cough by changing the medication. Another example might be a cough caused by acid reflux. Controlling the reflux helps control the cough.  USE OF BRONCHODILATOR ("RESCUE") INHALERS: There is a risk from using your bronchodilator too frequently.  The risk is that over-reliance on a medication which only relaxes the muscles surrounding the breathing tubes can reduce the effectiveness of medications prescribed to reduce swelling and congestion of the tubes themselves.  Although you feel brief relief from the bronchodilator inhaler, your asthma may actually be worsening with the tubes becoming more swollen and filled with mucus.  This can delay other  crucial treatments, such as oral steroid medications. If you need to use a bronchodilator inhaler daily, several times per day, you should discuss this with your provider.  There are probably better treatments that could be used to keep your asthma under control.     HOME CARE Only take medications as instructed by your medical team. Complete the entire course of an antibiotic. Drink plenty of fluids and get plenty of rest. Avoid close contacts especially the very young and the elderly Cover your mouth if you cough or cough into your sleeve. Always remember to wash your hands A steam or ultrasonic humidifier can help congestion.   GET HELP RIGHT AWAY IF: You develop worsening fever. You become short of breath You cough up blood. Your symptoms persist after you have completed your treatment plan MAKE SURE YOU  Understand these instructions. Will watch your condition. Will get help right away if you are not doing well or get worse.    Thank you for choosing an e-visit.  Your e-visit answers were reviewed by a board certified advanced clinical practitioner to complete your personal care plan. Depending upon the condition, your plan could have included both over the counter or prescription medications.  Please review your pharmacy choice. Make sure the pharmacy is open so you can pick up prescription now. If there is a problem, you may contact your provider through [employer] and have the prescription routed to another pharmacy.  Your safety is important to [place]. If you have drug allergies check your prescription carefully.   For the next 24 hours  you can use MyChart to ask questions about today's visit, request a non-urgent call back, or ask for a work or school excuse. You will get an email in the next two days asking about your experience. I hope that your e-visit has been valuable and will speed your recovery.   I have spent 5 minutes in review of e-visit questionnaire, review and  updating patient chart, medical decision making and response to patient.   Gilberto Better, PA-C

## 2023-10-11 ENCOUNTER — Emergency Department (HOSPITAL_COMMUNITY)
Admission: EM | Admit: 2023-10-11 | Discharge: 2023-10-12 | Disposition: A | Discharge status: Home / Self Care | Payer: BC Managed Care – PPO | Attending: Emergency Medicine | Admitting: Emergency Medicine

## 2023-10-11 ENCOUNTER — Other Ambulatory Visit: Payer: Self-pay

## 2023-10-11 DIAGNOSIS — S81812A Laceration without foreign body, left lower leg, initial encounter: Secondary | ICD-10-CM | POA: Diagnosis not present

## 2023-10-11 DIAGNOSIS — W25XXXA Contact with sharp glass, initial encounter: Secondary | ICD-10-CM | POA: Insufficient documentation

## 2023-10-11 DIAGNOSIS — S8992XA Unspecified injury of left lower leg, initial encounter: Secondary | ICD-10-CM | POA: Diagnosis present
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
## 2023-10-12 MED ORDER — LIDOCAINE-EPINEPHRINE (PF) 2 %-1:200000 IJ SOLN
10.0000 mL | Freq: Once | INTRAMUSCULAR | Status: AC
  Administered 2023-10-12: 10 mL
  Filled 2023-10-12: qty 20

## 2023-10-12 NOTE — Discharge Instructions (Addendum)
Confirm your tetanus status with your primary care doctor. This should be updated within 48 hours if it was last administered over 5 years ago. Avoid soaking your wound in stagnant or dirty water such as while taking a bath. You can shower normally. Keep the area clean with mild soap and warm water. Do not apply peroxide or alcohol to your wound as this can break down newly forming skin and prolong wound healing. If you keep the area bandaged, change the dressing/bandage at least once per day. Have your staples/sutures removed in 10-12 days.
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
## 2024-02-15 ENCOUNTER — Telehealth: Admitting: Physician Assistant

## 2024-02-15 ENCOUNTER — Encounter

## 2024-02-15 DIAGNOSIS — B9689 Other specified bacterial agents as the cause of diseases classified elsewhere: Secondary | ICD-10-CM

## 2024-02-15 DIAGNOSIS — J208 Acute bronchitis due to other specified organisms: Secondary | ICD-10-CM

## 2024-02-15 DIAGNOSIS — J22 Unspecified acute lower respiratory infection: Secondary | ICD-10-CM

## 2024-02-15 MED ORDER — AZITHROMYCIN 250 MG PO TABS: ORAL_TABLET | ORAL | 0 refills | Status: DC

## 2024-02-15 MED ORDER — ALBUTEROL SULFATE HFA 108 (90 BASE) MCG/ACT IN AERS: 2.0000 | INHALATION_SPRAY | Freq: Four times a day (QID) | RESPIRATORY_TRACT | 1 refills | Status: DC | PRN

## 2024-02-15 MED ORDER — BENZONATATE 200 MG PO CAPS: 200.0000 mg | ORAL_CAPSULE | Freq: Three times a day (TID) | ORAL | 0 refills | Status: DC | PRN

## 2024-02-15 NOTE — Progress Notes (Signed)
 E-Visit for Cough   We are sorry that you are not feeling well.  Here is how we plan to help!  Based on your presentation I believe you most likely have A cough due to bacteria.  When patients have a fever and a productive cough with a change in color or increased sputum production, we are concerned about bacterial bronchitis.  If left untreated it can progress to pneumonia.  If your symptoms do not improve with your treatment plan it is important that you contact your provider.   I have prescribed Azithromyin 250 mg: two tablets now and then one tablet daily for 4 additonal days    In addition you may use A prescription cough medication called Tessalon Perles 100mg . You may take 1-2 capsules every 8 hours as needed for your cough. I have also added on an albuterol inhaler to use as directed.  From your responses in the eVisit questionnaire you describe inflammation in the upper respiratory tract which is causing a significant cough.  This is commonly called Bronchitis and has four common causes:   Allergies Viral Infections Acid Reflux Bacterial Infection Allergies, viruses and acid reflux are treated by controlling symptoms or eliminating the cause. An example might be a cough caused by taking certain blood pressure medications. You stop the cough by changing the medication. Another example might be a cough caused by acid reflux. Controlling the reflux helps control the cough.  USE OF BRONCHODILATOR ("RESCUE") INHALERS: There is a risk from using your bronchodilator too frequently.  The risk is that over-reliance on a medication which only relaxes the muscles surrounding the breathing tubes can reduce the effectiveness of medications prescribed to reduce swelling and congestion of the tubes themselves.  Although you feel brief relief from the bronchodilator inhaler, your asthma may actually be worsening with the tubes becoming more swollen and filled with mucus.  This can delay other crucial  treatments, such as oral steroid medications. If you need to use a bronchodilator inhaler daily, several times per day, you should discuss this with your provider.  There are probably better treatments that could be used to keep your asthma under control.     HOME CARE Only take medications as instructed by your medical team. Complete the entire course of an antibiotic. Drink plenty of fluids and get plenty of rest. Avoid close contacts especially the very young and the elderly Cover your mouth if you cough or cough into your sleeve. Always remember to wash your hands A steam or ultrasonic humidifier can help congestion.   GET HELP RIGHT AWAY IF: You develop worsening fever. You become short of breath You cough up blood. Your symptoms persist after you have completed your treatment plan MAKE SURE YOU  Understand these instructions. Will watch your condition. Will get help right away if you are not doing well or get worse.    Thank you for choosing an e-visit.  Your e-visit answers were reviewed by a board certified advanced clinical practitioner to complete your personal care plan. Depending upon the condition, your plan could have included both over the counter or prescription medications.  Please review your pharmacy choice. Make sure the pharmacy is open so you can pick up prescription now. If there is a problem, you may contact your provider through [employer] and have the prescription routed to another pharmacy.  Your safety is important to [place]. If you have drug allergies check your prescription carefully.   For the next 24 hours you  can use MyChart to ask questions about today's visit, request a non-urgent call back, or ask for a work or school excuse. You will get an email in the next two days asking about your experience. I hope that your e-visit has been valuable and will speed your recovery.

## 2024-02-15 NOTE — Progress Notes (Signed)
 I have spent 5 minutes in review of e-visit questionnaire, review and updating patient chart, medical decision making and response to patient.   Piedad Climes, PA-C

## 2024-02-16 MED ORDER — AZITHROMYCIN 250 MG PO TABS: ORAL_TABLET | ORAL | 0 refills | Status: DC

## 2024-02-16 NOTE — Addendum Note (Signed)
 Addended by: Karrie Meres on: 02/16/2024 07:04 AM   Modules accepted: Orders

## 2024-05-14 ENCOUNTER — Telehealth: Admitting: Physician Assistant

## 2024-05-14 DIAGNOSIS — S30860A Insect bite (nonvenomous) of lower back and pelvis, initial encounter: Secondary | ICD-10-CM | POA: Diagnosis not present

## 2024-05-14 DIAGNOSIS — L03119 Cellulitis of unspecified part of limb: Secondary | ICD-10-CM | POA: Diagnosis not present

## 2024-05-14 DIAGNOSIS — W57XXXA Bitten or stung by nonvenomous insect and other nonvenomous arthropods, initial encounter: Secondary | ICD-10-CM | POA: Diagnosis not present

## 2024-05-14 MED ORDER — SULFAMETHOXAZOLE-TRIMETHOPRIM 800-160 MG PO TABS: 1.0000 | ORAL_TABLET | Freq: Two times a day (BID) | ORAL | 0 refills | Status: DC

## 2024-05-14 MED ORDER — TRIAMCINOLONE ACETONIDE 0.1 % EX CREA: 1.0000 | TOPICAL_CREAM | Freq: Two times a day (BID) | CUTANEOUS | 0 refills | Status: DC

## 2024-05-14 NOTE — Progress Notes (Signed)
 E-Visit for Cellulitis  We are sorry that you are not feeling well. Here is how we plan to help!  Based on what you shared with me it looks like you have cellulitis.  Cellulitis looks like areas of skin redness, swelling, and warmth; it develops as a result of bacteria entering under the skin. Little red spots and/or bleeding can be seen in skin, and tiny surface sacs containing fluid can occur. Fever can be present. Cellulitis is almost always on one side of a body, and the lower limbs are the most common site of involvement.   I have prescribed:  Bactrim DS 1 tablet by mouth twice a day for 7 days. I'm also sending a Rx for Triamcinolone cream(steroid) to apply twice daily.    HOME CARE:  Take your medications as ordered and take all of them, even if the skin irritation appears to be healing.   GET HELP RIGHT AWAY IF:  Symptoms that don't begin to go away within 48 hours. Severe redness persists or worsens If the area turns color, spreads or swells. If it blisters and opens, develops yellow-brown crust or bleeds. You develop a fever or chills. If the pain increases or becomes unbearable.  Are unable to keep fluids and food down.  MAKE SURE YOU   Understand these instructions. Will watch your condition. Will get help right away if you are not doing well or get worse.  Thank you for choosing an e-visit.  Your e-visit answers were reviewed by a board certified advanced clinical practitioner to complete your personal care plan. Depending upon the condition, your plan could have included both over the counter or prescription medications.  Please review your pharmacy choice. Make sure the pharmacy is open so you can pick up prescription now. If there is a problem, you may contact your provider through [employer] and have the prescription routed to another pharmacy.  Your safety is important to us . If you have drug allergies check your prescription carefully.   For the next 24  hours you can use MyChart to ask questions about today's visit, request a non-urgent call back, or ask for a work or school excuse. You will get an email in the next two days asking about your experience. I hope that your e-visit has been valuable and will speed your recovery.   I have spent 5 minutes in review of e-visit questionnaire, review and updating patient chart, medical decision making and response to patient.   Carmelina Balducci, PA-C

## 2024-05-17 ENCOUNTER — Telehealth: Admitting: Physician Assistant

## 2024-05-17 DIAGNOSIS — L255 Unspecified contact dermatitis due to plants, except food: Secondary | ICD-10-CM

## 2024-05-17 MED ORDER — PREDNISONE 10 MG (21) PO TBPK: ORAL_TABLET | ORAL | 0 refills | Status: DC

## 2024-05-17 NOTE — Progress Notes (Signed)

## 2024-07-03 ENCOUNTER — Telehealth: Admitting: Nurse Practitioner

## 2024-07-03 DIAGNOSIS — R300 Dysuria: Secondary | ICD-10-CM

## 2024-07-03 NOTE — Progress Notes (Signed)
  Because of your fever, back pain and fatigue along with your urinary symptoms and vaginal symptoms, I feel your condition warrants further evaluation and I recommend that you be seen in a face-to-face visit.   You also have a complex history and higher risk due to having kidney infections in the past  It is best that you have urine testing and culture to assure correct therapy is initiated   NOTE: There will be NO CHARGE for this E-Visit   If you are having a true medical emergency, please call 911.     For an urgent face to face visit, [place] has multiple urgent care centers for your convenience.  Click the link below for the full list of locations and hours, walk-in wait times, appointment scheduling options and driving directions:  Urgent Care - [place], [place], [place], [place], [place], [place]  [place]     Your MyChart E-visit questionnaire answers were reviewed by a board certified advanced clinical practitioner to complete your personal care plan based on your specific symptoms.    Thank you for using e-Visits.

## 2024-08-10 ENCOUNTER — Telehealth: Admitting: Nurse Practitioner

## 2024-08-10 DIAGNOSIS — J029 Acute pharyngitis, unspecified: Secondary | ICD-10-CM | POA: Diagnosis not present

## 2024-08-10 NOTE — Progress Notes (Signed)
 We are sorry that you are not feeling well.  Here is how we plan to help!  Your symptoms indicate a likely viral infection (Pharyngitis).   Pharyngitis is inflammation in the back of the throat which can cause a sore throat, scratchiness and sometimes difficulty swallowing.   Pharyngitis is typically caused by a respiratory virus and will just run its course.  Please keep in mind that your symptoms could last up to 10 days.  For throat pain, we recommend over the counter oral pain relief medications such as acetaminophen  or aspirin , or anti-inflammatory medications such as ibuprofen or naproxen sodium.  Topical treatments such as oral throat lozenges or sprays may be used as needed.  Avoid close contact with loved ones, especially the very young and elderly.  Remember to wash your hands thoroughly throughout the day as this is the number one way to prevent the spread of infection and wipe down door knobs and counters with disinfectant.  After careful review of your answers, I would not recommend and antibiotic for your condition.  Antibiotics should not be used to treat conditions that we suspect are caused by viruses like the virus that causes the common cold or flu. However, some people can have Strep with atypical symptoms. You may need formal testing in clinic or office to confirm if your symptoms continue or worsen.  Providers prescribe antibiotics to treat infections caused by bacteria. Antibiotics are very powerful in treating bacterial infections when they are used properly.  To maintain their effectiveness, they should be used only when necessary.  Overuse of antibiotics has resulted in the development of super bugs that are resistant to treatment!    Home Care: Only take medications as instructed by your medical team. Do not drink alcohol while taking these medications. A steam or ultrasonic humidifier can help congestion.  You can place a towel over your head and breathe in the steam from hot  water coming from a faucet. Avoid close contacts especially the very young and the elderly. Cover your mouth when you cough or sneeze. Always remember to wash your hands.  Get Help Right Away If: You develop worsening fever or throat pain. You develop a severe head ache or visual changes. Your symptoms persist after you have completed your treatment plan.  Make sure you Understand these instructions. Will watch your condition. Will get help right away if you are not doing well or get worse.  Your e-visit answers were reviewed by a board certified advanced clinical practitioner to complete your personal care plan.  Depending on the condition, your plan could have included both over the counter or prescription medications.  If there is a problem please reply  once you have received a response from your provider.  Your safety is important to us .  If you have drug allergies check your prescription carefully.    You can use MyChart to ask questions about todays visit, request a non-urgent call back, or ask for a work or school excuse for 24 hours related to this e-Visit. If it has been greater than 24 hours you will need to follow up with your provider, or enter a new e-Visit to address those concerns.  You will get an e-mail in the next two days asking about your experience.  I hope that your e-visit has been valuable and will speed your recovery. Thank you for using e-visits.   I have spent 5 minutes in review of e-visit questionnaire, review and updating patient chart, medical  decision making and response to patient.   Rodrickus Min W Robin Petrakis, NP

## 2024-10-06 ENCOUNTER — Telehealth: Admitting: Family

## 2024-10-06 DIAGNOSIS — J019 Acute sinusitis, unspecified: Secondary | ICD-10-CM

## 2024-10-06 MED ORDER — DOXYCYCLINE HYCLATE 100 MG PO TABS: 100.0000 mg | ORAL_TABLET | Freq: Two times a day (BID) | ORAL | 0 refills | Status: AC

## 2024-10-06 NOTE — Progress Notes (Signed)
 E-Visit for Sinus Problems  We are sorry that you are not feeling well.  Here is how we plan to help!  Based on what you have shared with me it looks like you have sinusitis.  Sinusitis is inflammation and infection in the sinus cavities of the head.  Based on your presentation I believe you most likely have Acute Bacterial Sinusitis.  This is an infection caused by bacteria and is treated with antibiotics. I have prescribed Doxycycline  100mg  by mouth twice a day for 7 days. You may use an oral decongestant such as Mucinex D or if you have glaucoma or high blood pressure use plain Mucinex. Saline nasal spray help and can safely be used as often as needed for congestion.  If you develop worsening sinus pain, fever or notice severe headache and vision changes, or if symptoms are not better after completion of antibiotic, please schedule an appointment with a health care provider.    Sinus infections are not as easily transmitted as other respiratory infection, however we still recommend that you avoid close contact with loved ones, especially the very young and elderly.  Remember to wash your hands thoroughly throughout the day as this is the number one way to prevent the spread of infection!  Home Care: Only take medications as instructed by your medical team. Complete the entire course of an antibiotic. Do not take these medications with alcohol. A steam or ultrasonic humidifier can help congestion.  You can place a towel over your head and breathe in the steam from hot water coming from a faucet. Avoid close contacts especially the very young and the elderly. Cover your mouth when you cough or sneeze. Always remember to wash your hands.  Get Help Right Away If: You develop worsening fever or sinus pain. You develop a severe head ache or visual changes. Your symptoms persist after you have completed your treatment plan.  Make sure you Understand these instructions. Will watch your  condition. Will get help right away if you are not doing well or get worse.  Your e-visit answers were reviewed by a board certified advanced clinical practitioner to complete your personal care plan.  Depending on the condition, your plan could have included both over the counter or prescription medications.  If there is a problem please reply  once you have received a response from your provider.  Your safety is important to us .  If you have drug allergies check your prescription carefully.    You can use MyChart to ask questions about today's visit, request a non-urgent call back, or ask for a work or school excuse for 24 hours related to this e-Visit. If it has been greater than 24 hours you will need to follow up with your provider, or enter a new e-Visit to address those concerns.  You will get an e-mail in the next two days asking about your experience.  I hope that your e-visit has been valuable and will speed your recovery. Thank you for using e-visits.  I have spent 5 minutes in review of e-visit questionnaire, review and updating patient chart, medical decision making and response to patient.   Bari Learn, FNP
